# Patient Record
Sex: Female | Born: 1990 | Race: White | Hispanic: No | Marital: Married | Smoking: Never smoker
Health system: Southern US, Community
[De-identification: ages and names within clinical notes are randomized; demographics above are authoritative.]

## PROBLEM LIST (undated history)

## (undated) ENCOUNTER — Inpatient Hospital Stay (HOSPITAL_COMMUNITY): Payer: Self-pay

## (undated) DIAGNOSIS — F419 Anxiety disorder, unspecified: Secondary | ICD-10-CM

## (undated) DIAGNOSIS — N12 Tubulo-interstitial nephritis, not specified as acute or chronic: Secondary | ICD-10-CM

## (undated) DIAGNOSIS — F32A Depression, unspecified: Secondary | ICD-10-CM

## (undated) DIAGNOSIS — S060XAA Concussion with loss of consciousness status unknown, initial encounter: Secondary | ICD-10-CM

## (undated) DIAGNOSIS — O98811 Other maternal infectious and parasitic diseases complicating pregnancy, first trimester: Secondary | ICD-10-CM

## (undated) DIAGNOSIS — F329 Major depressive disorder, single episode, unspecified: Secondary | ICD-10-CM

## (undated) DIAGNOSIS — S060X9A Concussion with loss of consciousness of unspecified duration, initial encounter: Secondary | ICD-10-CM

## (undated) DIAGNOSIS — F99 Mental disorder, not otherwise specified: Secondary | ICD-10-CM

## (undated) DIAGNOSIS — A749 Chlamydial infection, unspecified: Secondary | ICD-10-CM

## (undated) DIAGNOSIS — E039 Hypothyroidism, unspecified: Secondary | ICD-10-CM

## (undated) HISTORY — DX: Concussion with loss of consciousness of unspecified duration, initial encounter: S06.0X9A

## (undated) HISTORY — PX: TONSILLECTOMY: SUR1361

## (undated) HISTORY — DX: Concussion with loss of consciousness status unknown, initial encounter: S06.0XAA

---
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
## 2011-09-10 LAB — HM PAP SMEAR: HM Pap smear: NORMAL

## 2015-02-04 ENCOUNTER — Ambulatory Visit (INDEPENDENT_AMBULATORY_CARE_PROVIDER_SITE_OTHER): Payer: BLUE CROSS/BLUE SHIELD | Admitting: Nurse Practitioner

## 2015-02-04 ENCOUNTER — Encounter: Payer: Self-pay | Admitting: Nurse Practitioner

## 2015-02-04 VITALS — BP 120/78 | HR 73 | Temp 98.3°F | Resp 12 | Ht 64.0 in | Wt 176.8 lb

## 2015-02-04 DIAGNOSIS — Z7689 Persons encountering health services in other specified circumstances: Secondary | ICD-10-CM

## 2015-02-04 DIAGNOSIS — Z7189 Other specified counseling: Secondary | ICD-10-CM | POA: Diagnosis not present

## 2015-02-04 DIAGNOSIS — R197 Diarrhea, unspecified: Secondary | ICD-10-CM

## 2015-02-04 LAB — CBC WITH DIFFERENTIAL/PLATELET
BASOS PCT: 0 % (ref 0–1)
Basophils Absolute: 0 10*3/uL (ref 0.0–0.1)
EOS ABS: 0.1 10*3/uL (ref 0.0–0.7)
EOS PCT: 1 % (ref 0–5)
HCT: 39.1 % (ref 36.0–46.0)
Hemoglobin: 13.5 g/dL (ref 12.0–15.0)
Lymphocytes Relative: 28 % (ref 12–46)
Lymphs Abs: 2 10*3/uL (ref 0.7–4.0)
MCH: 30.2 pg (ref 26.0–34.0)
MCHC: 34.5 g/dL (ref 30.0–36.0)
MCV: 87.5 fL (ref 78.0–100.0)
MONO ABS: 0.6 10*3/uL (ref 0.1–1.0)
MONOS PCT: 9 % (ref 3–12)
MPV: 10 fL (ref 8.6–12.4)
NEUTROS ABS: 4.3 10*3/uL (ref 1.7–7.7)
Neutrophils Relative %: 62 % (ref 43–77)
PLATELETS: 304 10*3/uL (ref 150–400)
RBC: 4.47 MIL/uL (ref 3.87–5.11)
RDW: 12.2 % (ref 11.5–15.5)
WBC: 7 10*3/uL (ref 4.0–10.5)

## 2015-02-04 NOTE — Progress Notes (Signed)
Patient ID: Linda Dominguez, female    DOB: Oct 22, 1990  Age: 24 y.o. MRN: [id number]  CC: Establish Care   HPI Linda Dominguez presents for establishing care and CC of irregular cycles on nexplanon and diarrhea.   1) New pt info:   Immunizations- Will await records    In the [place]   Pap- Not UTD  Eye Exam- Not UTD  Dental Exam- UTD   LMP- 01/29/2015  2) Chronic Problems-  Overweight-    Health diet   Exercise- runs daily   3) Acute Problems-  Nexplanon- 24 years old in May, irregular periods  Diarrhea- since 24 yo, worse in the last 2 years,  Blood in stool, not with wiping, thinks it is dried up blood/dark, mucous every time  4-5 x a day BM- watery/runny  Stopped eating red meat- intensifies symptoms  Happens immediately with cramping  Lots of stressors- school, 2 jobs, and Nature conservation officer duty  Medications- Miralax if not gone for 1 week   Imodium- helpful  No recent abx use  History Linda Dominguez has a past medical history of Concussion.   She has past surgical history that includes Tonsillectomy and adenoidectomy (1995).   Her family history includes Alcohol abuse in her maternal grandmother; Depression in her father; Diabetes in her maternal grandfather and maternal grandmother; Early death in her paternal grandmother; Heart disease in her maternal grandfather and maternal grandmother; Hyperlipidemia in her maternal grandmother; Mental illness in her father; Stroke in her maternal grandfather.She reports that she has never smoked. She has never used smokeless tobacco. She reports that she drinks alcohol. She reports that she does not use illicit drugs.  No outpatient prescriptions prior to visit.   No facility-administered medications prior to visit.   ROS Review of Systems  Constitutional: Negative for fever, chills, diaphoresis and fatigue.  Respiratory: Negative for chest tightness, shortness of breath and wheezing.   Cardiovascular: Negative for chest pain,  palpitations and leg swelling.  Gastrointestinal: Positive for abdominal pain, diarrhea, constipation and blood in stool. Negative for nausea, vomiting, abdominal distention, anal bleeding and rectal pain.  Skin: Negative for rash.  Neurological: Negative for dizziness, weakness, numbness and headaches.  Psychiatric/Behavioral: The patient is nervous/anxious.        Lots of stressors currently   Objective:  BP 120/78 mmHg  Pulse 73  Temp(Src) 98.3 F (36.8 C)  Resp 12  Ht _0  (1.626 m)  Wt 176 lb 12.8 oz (80.196 kg)  BMI 30.33 kg/m2  SpO2 97%  Physical Exam  Constitutional: She is oriented to person, place, and time. She appears well-developed and well-nourished. No distress.  HENT:  Head: Normocephalic and atraumatic.  Right Ear: External ear normal.  Left Ear: External ear normal.  Cardiovascular: Normal rate, regular rhythm and normal heart sounds.   Pulmonary/Chest: Effort normal and breath sounds normal. No respiratory distress. She has no wheezes. She has no rales. She exhibits no tenderness.  Abdominal: Soft. Bowel sounds are normal. She exhibits no distension and no mass. There is tenderness. There is no rebound and no guarding.  Generalized tenderness  Genitourinary:  Deferred  Neurological: She is alert and oriented to person, place, and time. No cranial nerve deficit. She exhibits normal muscle tone. Coordination normal.  Skin: Skin is warm and dry. No rash noted. She is not diaphoretic.  Psychiatric: She has a normal mood and affect. Her behavior is normal. Judgment and thought content normal.   Assessment & Plan:   Lynette was seen  today for establish care.  Diagnoses and all orders for this visit:  Encounter to establish care -     CBC with Differential/Platelet; Future -     Comp Met (CMET) -     CBC with Differential/Platelet  Diarrhea, unspecified type -     Ambulatory referral to Gastroenterology -     Cancel: CBC with Differential/Platelet -      Cancel: Comp Met (CMET) -     CBC with Differential/Platelet; Future -     Comp Met (CMET) -     CBC with Differential/Platelet   I am having Linda Dominguez maintain her Etonogestrel (NEXPLANON [place]).  Meds ordered this encounter  Medications  . Etonogestrel (NEXPLANON )    Sig: Inject into the skin.     Follow-up: Return if symptoms worsen or fail to improve.

## 2015-02-04 NOTE — Patient Instructions (Signed)
Nice to meet you! Follow up after your gastroenterology appointment with me (you can call and make this appointment).   We will call you with the referral to gastroenterology.

## 2015-02-04 NOTE — Assessment & Plan Note (Addendum)
Diarrhea has been going on since she was age 24 and patientreports she has talked to multiple people about this and was diagnosed with IBS diarrhea predominant.she would prefer to go to a gastroenterologist for evaluation and treatment. Ambulatory referral for gastroenterology was placed we'll follow-up after this appointment .  We'll obtain C met CBC with differential

## 2015-02-04 NOTE — Assessment & Plan Note (Signed)
Discussed acute and chronic issues. Reviewed health maintenance measures, PFSHx, and immunizations. Obtain records from previous facility.   

## 2015-02-04 NOTE — Progress Notes (Signed)
Pre visit review using our clinic review tool, if applicable. No additional management support is needed unless otherwise documented below in the visit note. 

## 2015-02-05 LAB — COMPREHENSIVE METABOLIC PANEL
ALK PHOS: 61 U/L (ref 33–115)
ALT: 17 U/L (ref 6–29)
AST: 16 U/L (ref 10–30)
Albumin: 4.4 g/dL (ref 3.6–5.1)
BILIRUBIN TOTAL: 0.5 mg/dL (ref 0.2–1.2)
BUN: 12 mg/dL (ref 7–25)
CO2: 28 mmol/L (ref 20–31)
Calcium: 9.5 mg/dL (ref 8.6–10.2)
Chloride: 100 mmol/L (ref 98–110)
Creat: 0.75 mg/dL (ref 0.50–1.10)
GLUCOSE: 80 mg/dL (ref 65–99)
POTASSIUM: 4.6 mmol/L (ref 3.5–5.3)
Sodium: 135 mmol/L (ref 135–146)
TOTAL PROTEIN: 7.3 g/dL (ref 6.1–8.1)

## 2015-02-23 ENCOUNTER — Other Ambulatory Visit: Payer: Self-pay | Admitting: Nurse Practitioner

## 2015-02-23 DIAGNOSIS — R194 Change in bowel habit: Secondary | ICD-10-CM

## 2015-03-02 ENCOUNTER — Ambulatory Visit: Payer: BLUE CROSS/BLUE SHIELD

## 2015-03-04 ENCOUNTER — Ambulatory Visit: Admission: RE | Admit: 2015-03-04 | Payer: BLUE CROSS/BLUE SHIELD | Source: Ambulatory Visit

## 2015-03-18 ENCOUNTER — Encounter: Payer: BLUE CROSS/BLUE SHIELD | Admitting: Nurse Practitioner

## 2015-05-01 DIAGNOSIS — E039 Hypothyroidism, unspecified: Secondary | ICD-10-CM

## 2015-05-01 HISTORY — DX: Hypothyroidism, unspecified: E03.9

## 2015-05-06 ENCOUNTER — Ambulatory Visit: Payer: BLUE CROSS/BLUE SHIELD | Admitting: Registered Nurse

## 2015-05-06 ENCOUNTER — Ambulatory Visit
Admission: RE | Admit: 2015-05-06 | Discharge: 2015-05-06 | Disposition: A | Discharge status: Home / Self Care | Payer: BLUE CROSS/BLUE SHIELD | Source: Ambulatory Visit | Attending: Gastroenterology | Admitting: Gastroenterology

## 2015-05-06 ENCOUNTER — Encounter
Admission: RE | Disposition: A | Discharge status: Home / Self Care | Payer: Self-pay | Source: Ambulatory Visit | Attending: Gastroenterology

## 2015-05-06 ENCOUNTER — Encounter: Payer: Self-pay | Admitting: *Deleted

## 2015-05-06 DIAGNOSIS — R194 Change in bowel habit: Secondary | ICD-10-CM | POA: Insufficient documentation

## 2015-05-06 HISTORY — PX: COLONOSCOPY WITH PROPOFOL: SHX5780

## 2015-05-06 LAB — POCT PREGNANCY, URINE: Preg Test, Ur: NEGATIVE

## 2015-05-06 SURGERY — COLONOSCOPY WITH PROPOFOL
Anesthesia: General

## 2015-05-06 MED ORDER — FENTANYL CITRATE (PF) 100 MCG/2ML IJ SOLN
INTRAMUSCULAR | Status: DC | PRN
  Administered 2015-05-06: 50 ug via INTRAVENOUS

## 2015-05-06 MED ORDER — SODIUM CHLORIDE 0.9 % IV SOLN: INTRAVENOUS | Status: DC

## 2015-05-06 MED ORDER — PROPOFOL 10 MG/ML IV BOLUS
INTRAVENOUS | Status: DC | PRN
  Administered 2015-05-06: 50 mg via INTRAVENOUS

## 2015-05-06 MED ORDER — PROPOFOL 500 MG/50ML IV EMUL
INTRAVENOUS | Status: DC | PRN
  Administered 2015-05-06: 170 ug/kg/min via INTRAVENOUS

## 2015-05-06 MED ORDER — SODIUM CHLORIDE 0.9 % IV SOLN
INTRAVENOUS | Status: DC
  Administered 2015-05-06: 1000 mL via INTRAVENOUS

## 2015-05-06 MED ORDER — MIDAZOLAM HCL 2 MG/2ML IJ SOLN
INTRAMUSCULAR | Status: DC | PRN
  Administered 2015-05-06: 1 mg via INTRAVENOUS

## 2015-05-06 MED ORDER — LIDOCAINE HCL (CARDIAC) 20 MG/ML IV SOLN
INTRAVENOUS | Status: DC | PRN
  Administered 2015-05-06: 40 mg via INTRAVENOUS

## 2015-05-06 NOTE — Op Note (Signed)
[facility] Gastroenterology Patient Name: Linda Dominguez Procedure Date: 05/06/2015 9:08 AM MRN: [id number] Account #: [id number] Date of Birth: 10/21/1990 Admit Type: Outpatient Age: 25 Room: [facility] ENDO ROOM 4 Gender: Female Note Status: Finalized Procedure:         Colonoscopy Indications:       Change in bowel habits Providers:         Ezzard Standing. Bluford Kaufmann, MD Referring MD:      Naomie Dean (Referring MD) Medicines:         Monitored Anesthesia Care Complications:     No immediate complications. Procedure:         Pre-Anesthesia Assessment:                    - Prior to the procedure, a History and Physical was                     performed, and patient medications, allergies and                     sensitivities were reviewed. The patient's tolerance of                     previous anesthesia was reviewed.                    - The risks and benefits of the procedure and the sedation                     options and risks were discussed with the patient. All                     questions were answered and informed consent was obtained.                    - After reviewing the risks and benefits, the patient was                     deemed in satisfactory condition to undergo the procedure.                    After obtaining informed consent, the colonoscope was                     passed under direct vision. Throughout the procedure, the                     patient's blood pressure, pulse, and oxygen saturations                     were monitored continuously. The Colonoscope was                     introduced through the anus and advanced to the the                     terminal ileum, with identification of the appendiceal                     orifice and IC valve. The colonoscopy was performed                     without difficulty. The patient tolerated the procedure  well. The quality of the bowel preparation was good. Findings:      The colon (entire  examined portion) appeared normal. Biopsies for       histology were taken with a cold forceps from the entire colon for       evaluation of microscopic colitis.      The terminal ileum appeared normal. Biopsies were taken with a cold       forceps for histology. Impression:        - The entire examined colon is normal. Biopsied.                    - The examined portion of the ileum was normal. Biopsied. Recommendation:    - Discharge patient to home.                    - Await pathology results.                    - The findings and recommendations were discussed with the                     patient. Procedure Code(s): --- Professional ---                    [id number], Colonoscopy, flexible; with biopsy, single or                     multiple Diagnosis Code(s): --- Professional ---                    R19.4, Change in bowel habit CPT copyright 2014 American Medical Association. All rights reserved. The codes documented in this report are preliminary and upon coder review may  be revised to meet current compliance requirements. Wallace CullensPaul Y Yarelly Kuba, MD 05/06/2015 9:28:09 AM This report has been signed electronically. Number of Addenda: 0 Note Initiated On: 05/06/2015 9:08 AM Scope Withdrawal Time: 0 hours 6 minutes 1 second  Total Procedure Duration: 0 hours 12 minutes 0 seconds       [facility]

## 2015-05-06 NOTE — H&P (Signed)
    Primary Care Physician:  Carollee Leitzoss, Carrie M, NP Primary Gastroenterologist:  Dr. Bluford Kaufmannh  Pre-Procedure History & Physical: HPI:  Linda Dominguez is a 25 y.o. female is here for an colonoscopy.  Past Medical History  Diagnosis Date  . Concussion     She's had 3, last one was when she was 20  . Medical history non-contributory     Past Surgical History  Procedure Laterality Date  . Tonsillectomy and adenoidectomy  1995  . Tonsillectomy      Prior to Admission medications   Medication Sig Start Date End Date Taking? Authorizing Provider  dicyclomine (BENTYL) 10 MG capsule Take 10 mg by mouth 4 (four) times daily -  before meals and at bedtime.   Yes Historical Provider, MD  Etonogestrel (NEXPLANON [place]) Inject into the skin.    Historical Provider, MD    Allergies as of 02/23/2015  . (No Known Allergies)    Family History  Problem Relation Age of Onset  . Depression Father   . Mental illness Father     Attempted Suicide  . Alcohol abuse Maternal Grandmother   . Hyperlipidemia Maternal Grandmother   . Heart disease Maternal Grandmother   . Diabetes Maternal Grandmother   . Heart disease Maternal Grandfather   . Stroke Maternal Grandfather   . Diabetes Maternal Grandfather   . Early death Paternal Grandmother     Social History   Social History  . Marital Status: Single    Spouse Name: N/A  . Number of Children: N/A  . Years of Education: N/A   Occupational History  . Not on file.   Social History Main Topics  . Smoking status: Never Smoker   . Smokeless tobacco: Never Used  . Alcohol Use: 0.0 oz/week    0 Standard drinks or equivalent per week     Comment: Socially   . Drug Use: No  . Sexual Activity:    Partners: Male    Birth Control/ Protection: Implant   Other Topics Concern  . 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 Neck:  Supple; no masses or thyromegaly. Lungs:  Clear throughout to auscultation.    Heart:  Regular rate and rhythm. Abdomen:  Soft, nontender and nondistended. Normal bowel sounds, without guarding, and without rebound.   Neurologic:  Alert and  oriented x4;  grossly normal neurologically.  Impression/Plan: Linda Dominguez is here for an colonoscopy to be performed for changes in bowel habits  Risks, benefits, limitations, and alternatives regarding  colonsocopy have been reviewed with the patient.  Questions have been answered.  All parties agreeable.   Lilian Fuhs, Ezzard StandingPAUL Y, MD  05/06/2015, 9:10 AM

## 2015-05-06 NOTE — Anesthesia Procedure Notes (Signed)
Date/Time: 05/06/2015 9:08 AM Performed by: Stormy FabianURTIS, Audrick Lamoureaux Pre-anesthesia Checklist: Patient identified, Emergency Drugs available, Suction available and Patient being monitored Patient Re-evaluated:Patient Re-evaluated prior to inductionOxygen Delivery Method: Nasal cannula Intubation Type: IV induction Dental Injury: Teeth and Oropharynx as per pre-operative assessment  Comments: Nasal cannula with etCO2 monitoring

## 2015-05-06 NOTE — Transfer of Care (Signed)
Immediate Anesthesia Transfer of Care Note  Patient: Linda Dominguez  Procedure(s) Performed: Procedure(s): COLONOSCOPY WITH PROPOFOL (N/A)  Patient Location: PACU and Endoscopy Unit  Anesthesia Type:General  Level of Consciousness: sedated  Airway & Oxygen Therapy: Patient Spontanous Breathing and Patient connected to nasal cannula oxygen  Post-op Assessment: Report given to RN and Post -op Vital signs reviewed and stable  Post vital signs: Reviewed and stable  Last Vitals:  Filed Vitals:   05/06/15 0844 05/06/15 0930  BP: 121/76 99/64  Pulse: 64 78  Temp: 35.4 C 36.5 C  Resp: 16 18    Complications: No apparent anesthesia complications

## 2015-05-06 NOTE — Anesthesia Preprocedure Evaluation (Signed)
Anesthesia Evaluation  Patient identified by MRN, date of birth, ID band Patient awake    History of Anesthesia Complications Negative for: history of anesthetic complications  Airway Mallampati: II       Dental  (+) Teeth Intact   Pulmonary neg pulmonary ROS,    Pulmonary exam normal        Cardiovascular negative cardio ROS   Rhythm:Regular Rate:Normal     Neuro/Psych    GI/Hepatic negative GI ROS, Neg liver ROS,   Endo/Other  negative endocrine ROS  Renal/GU negative Renal ROS     Musculoskeletal negative musculoskeletal ROS (+)   Abdominal Normal abdominal exam  (+)   Peds negative pediatric ROS (+)  Hematology negative hematology ROS (+)   Anesthesia Other Findings   Reproductive/Obstetrics                             Anesthesia Physical Anesthesia Plan  ASA: II  Anesthesia Plan: General   Post-op Pain Management:    Induction: Intravenous  Airway Management Planned: Nasal Cannula  Additional Equipment:   Intra-op Plan:   Post-operative Plan: Extubation in OR  Informed Consent: I have reviewed the patients History and Physical, chart, labs and discussed the procedure including the risks, benefits and alternatives for the proposed anesthesia with the patient or authorized representative who has indicated his/her understanding and acceptance.     Plan Discussed with: CRNA  Anesthesia Plan Comments:         Anesthesia Quick Evaluation

## 2015-05-06 NOTE — Anesthesia Postprocedure Evaluation (Signed)
Anesthesia Post Note  Patient: Linda Dominguez  Procedure(s) Performed: Procedure(s) (LRB): COLONOSCOPY WITH PROPOFOL (N/A)  Patient location during evaluation: PACU Anesthesia Type: General Level of consciousness: awake Pain management: satisfactory to patient Vital Signs Assessment: post-procedure vital signs reviewed and stable Respiratory status: spontaneous breathing Cardiovascular status: stable Anesthetic complications: no    Last Vitals:  Filed Vitals:   05/06/15 0925 05/06/15 0930  BP:  99/64  Pulse:  78  Temp: 36.5 C 36.5 C  Resp:  18    Last Pain:  Filed Vitals:   05/06/15 0931  PainSc: 0-No pain                 VAN STAVEREN,Keante Urizar

## 2015-05-09 LAB — SURGICAL PATHOLOGY

## 2015-05-12 ENCOUNTER — Encounter: Payer: Self-pay | Admitting: Gastroenterology

## 2015-06-27 ENCOUNTER — Ambulatory Visit (INDEPENDENT_AMBULATORY_CARE_PROVIDER_SITE_OTHER): Payer: BLUE CROSS/BLUE SHIELD | Admitting: Nurse Practitioner

## 2015-06-27 ENCOUNTER — Encounter: Payer: Self-pay | Admitting: Nurse Practitioner

## 2015-06-27 ENCOUNTER — Other Ambulatory Visit (HOSPITAL_COMMUNITY)
Admission: RE | Admit: 2015-06-27 | Discharge: 2015-06-27 | Disposition: A | Discharge status: Home / Self Care | Payer: BLUE CROSS/BLUE SHIELD | Source: Ambulatory Visit | Attending: Nurse Practitioner | Admitting: Nurse Practitioner

## 2015-06-27 VITALS — BP 108/62 | HR 74 | Temp 98.3°F | Resp 14 | Ht 65.0 in | Wt 186.6 lb

## 2015-06-27 DIAGNOSIS — Z0000 Encounter for general adult medical examination without abnormal findings: Secondary | ICD-10-CM | POA: Diagnosis not present

## 2015-06-27 DIAGNOSIS — Z1151 Encounter for screening for human papillomavirus (HPV): Secondary | ICD-10-CM | POA: Insufficient documentation

## 2015-06-27 DIAGNOSIS — Z01419 Encounter for gynecological examination (general) (routine) without abnormal findings: Secondary | ICD-10-CM | POA: Diagnosis not present

## 2015-06-27 LAB — COMPREHENSIVE METABOLIC PANEL WITH GFR
ALT: 23 U/L (ref 0–35)
AST: 19 U/L (ref 0–37)
Albumin: 4.6 g/dL (ref 3.5–5.2)
Alkaline Phosphatase: 55 U/L (ref 39–117)
BUN: 12 mg/dL (ref 6–23)
CO2: 25 meq/L (ref 19–32)
Calcium: 9.4 mg/dL (ref 8.4–10.5)
Chloride: 103 meq/L (ref 96–112)
Creatinine, Ser: 0.65 mg/dL (ref 0.40–1.20)
GFR: 119.66 mL/min
Glucose, Bld: 84 mg/dL (ref 70–99)
Potassium: 4.1 meq/L (ref 3.5–5.1)
Sodium: 136 meq/L (ref 135–145)
Total Bilirubin: 0.5 mg/dL (ref 0.2–1.2)
Total Protein: 8 g/dL (ref 6.0–8.3)

## 2015-06-27 LAB — CBC WITH DIFFERENTIAL/PLATELET
BASOS ABS: 0 10*3/uL (ref 0.0–0.1)
Basophils Relative: 0.6 % (ref 0.0–3.0)
EOS ABS: 0.1 10*3/uL (ref 0.0–0.7)
Eosinophils Relative: 1.5 % (ref 0.0–5.0)
HCT: 39.6 % (ref 36.0–46.0)
Hemoglobin: 13.4 g/dL (ref 12.0–15.0)
LYMPHS ABS: 1.9 10*3/uL (ref 0.7–4.0)
Lymphocytes Relative: 38.8 % (ref 12.0–46.0)
MCHC: 33.8 g/dL (ref 30.0–36.0)
MCV: 88.3 fl (ref 78.0–100.0)
MONOS PCT: 8.1 % (ref 3.0–12.0)
Monocytes Absolute: 0.4 10*3/uL (ref 0.1–1.0)
NEUTROS ABS: 2.5 10*3/uL (ref 1.4–7.7)
NEUTROS PCT: 51 % (ref 43.0–77.0)
PLATELETS: 319 10*3/uL (ref 150.0–400.0)
RBC: 4.48 Mil/uL (ref 3.87–5.11)
RDW: 13.4 % (ref 11.5–15.5)
WBC: 5 10*3/uL (ref 4.0–10.5)

## 2015-06-27 LAB — LIPID PANEL
Cholesterol: 163 mg/dL (ref 0–200)
HDL: 45.8 mg/dL
LDL Cholesterol: 109 mg/dL — ABNORMAL HIGH (ref 0–99)
NonHDL: 117.05
Total CHOL/HDL Ratio: 4
Triglycerides: 41 mg/dL (ref 0.0–149.0)
VLDL: 8.2 mg/dL (ref 0.0–40.0)

## 2015-06-27 LAB — TSH: TSH: 3.67 u[IU]/mL (ref 0.35–4.50)

## 2015-06-27 NOTE — Patient Instructions (Signed)
Health Maintenance, Female Adopting a healthy lifestyle and getting preventive care can go a long way to promote health and wellness. Talk with your health care provider about what schedule of regular examinations is right for you. This is a good chance for you to check in with your provider about disease prevention and staying healthy. In between checkups, there are plenty of things you can do on your own. Experts have done a lot of research about which lifestyle changes and preventive measures are most likely to keep you healthy. Ask your health care provider for more information. WEIGHT AND DIET  Eat a healthy diet  Be sure to include plenty of vegetables, fruits, low-fat dairy products, and lean protein.  Do not eat a lot of foods high in solid fats, added sugars, or salt.  Get regular exercise. This is one of the most important things you can do for your health.  Most adults should exercise for at least 150 minutes each week. The exercise should increase your heart rate and make you sweat (moderate-intensity exercise).  Most adults should also do strengthening exercises at least twice a week. This is in addition to the moderate-intensity exercise.  Maintain a healthy weight  Body mass index (BMI) is a measurement that can be used to identify possible weight problems. It estimates body fat based on height and weight. Your health care provider can help determine your BMI and help you achieve or maintain a healthy weight.  For females 20 years of age and older:   A BMI below 18.5 is considered underweight.  A BMI of 18.5 to 24.9 is normal.  A BMI of 25 to 29.9 is considered overweight.  A BMI of 30 and above is considered obese.  Watch levels of cholesterol and blood lipids  You should start having your blood tested for lipids and cholesterol at 24 years of age, then have this test every 5 years.  You may need to have your cholesterol levels checked more often if:  Your lipid  or cholesterol levels are high.  You are older than 24 years of age.  You are at high risk for heart disease.  CANCER SCREENING   Lung Cancer  Lung cancer screening is recommended for adults 55-80 years old who are at high risk for lung cancer because of a history of smoking.  A yearly low-dose CT scan of the lungs is recommended for people who:  Currently smoke.  Have quit within the past 15 years.  Have at least a 30-pack-year history of smoking. A pack year is smoking an average of one pack of cigarettes a day for 1 year.  Yearly screening should continue until it has been 15 years since you quit.  Yearly screening should stop if you develop a health problem that would prevent you from having lung cancer treatment.  Breast Cancer  Practice breast self-awareness. This means understanding how your breasts normally appear and feel.  It also means doing regular breast self-exams. Let your health care provider know about any changes, no matter how small.  If you are in your 20s or 30s, you should have a clinical breast exam (CBE) by a health care provider every 1-3 years as part of a regular health exam.  If you are 40 or older, have a CBE every year. Also consider having a breast X-ray (mammogram) every year.  If you have a family history of breast cancer, talk to your health care provider about genetic screening.  If you   are at high risk for breast cancer, talk to your health care provider about having an MRI and a mammogram every year.  Breast cancer gene (BRCA) assessment is recommended for women who have family members with BRCA-related cancers. BRCA-related cancers include:  Breast.  Ovarian.  Tubal.  Peritoneal cancers.  Results of the assessment will determine the need for genetic counseling and BRCA1 and BRCA2 testing. Cervical Cancer Your health care provider may recommend that you be screened regularly for cancer of the pelvic organs (ovaries, uterus, and  vagina). This screening involves a pelvic examination, including checking for microscopic changes to the surface of your cervix (Pap test). You may be encouraged to have this screening done every 3 years, beginning at age 21.  For women ages 30-65, health care providers may recommend pelvic exams and Pap testing every 3 years, or they may recommend the Pap and pelvic exam, combined with testing for human papilloma virus (HPV), every 5 years. Some types of HPV increase your risk of cervical cancer. Testing for HPV may also be done on women of any age with unclear Pap test results.  Other health care providers may not recommend any screening for nonpregnant women who are considered low risk for pelvic cancer and who do not have symptoms. Ask your health care provider if a screening pelvic exam is right for you.  If you have had past treatment for cervical cancer or a condition that could lead to cancer, you need Pap tests and screening for cancer for at least 20 years after your treatment. If Pap tests have been discontinued, your risk factors (such as having a new sexual partner) need to be reassessed to determine if screening should resume. Some women have medical problems that increase the chance of getting cervical cancer. In these cases, your health care provider may recommend more frequent screening and Pap tests. Colorectal Cancer  This type of cancer can be detected and often prevented.  Routine colorectal cancer screening usually begins at 24 years of age and continues through 24 years of age.  Your health care provider may recommend screening at an earlier age if you have risk factors for colon cancer.  Your health care provider may also recommend using home test kits to check for hidden blood in the stool.  A small camera at the end of a tube can be used to examine your colon directly (sigmoidoscopy or colonoscopy). This is done to check for the earliest forms of colorectal  cancer.  Routine screening usually begins at age 50.  Direct examination of the colon should be repeated every 5-10 years through 24 years of age. However, you may need to be screened more often if early forms of precancerous polyps or small growths are found. Skin Cancer  Check your skin from head to toe regularly.  Tell your health care provider about any new moles or changes in moles, especially if there is a change in a mole's shape or color.  Also tell your health care provider if you have a mole that is larger than the size of a pencil eraser.  Always use sunscreen. Apply sunscreen liberally and repeatedly throughout the day.  Protect yourself by wearing long sleeves, pants, a wide-brimmed hat, and sunglasses whenever you are outside. HEART DISEASE, DIABETES, AND HIGH BLOOD PRESSURE   High blood pressure causes heart disease and increases the risk of stroke. High blood pressure is more likely to develop in:  People who have blood pressure in the high end   of the normal range (130-139/85-89 mm Hg).  People who are overweight or obese.  People who are African American.  If you are 38-23 years of age, have your blood pressure checked every 3-5 years. If you are 61 years of age or older, have your blood pressure checked every year. You should have your blood pressure measured twice--once when you are at a hospital or clinic, and once when you are not at a hospital or clinic. Record the average of the two measurements. To check your blood pressure when you are not at a hospital or clinic, you can use:  An automated blood pressure machine at a pharmacy.  A home blood pressure monitor.  If you are between 45 years and 39 years old, ask your health care provider if you should take aspirin to prevent strokes.  Have regular diabetes screenings. This involves taking a blood sample to check your fasting blood sugar level.  If you are at a normal weight and have a low risk for diabetes,  have this test once every three years after 24 years of age.  If you are overweight and have a high risk for diabetes, consider being tested at a younger age or more often. PREVENTING INFECTION  Hepatitis B  If you have a higher risk for hepatitis B, you should be screened for this virus. You are considered at high risk for hepatitis B if:  You were born in a country where hepatitis B is common. Ask your health care provider which countries are considered high risk.  Your parents were born in a high-risk country, and you have not been immunized against hepatitis B (hepatitis B vaccine).  You have HIV or AIDS.  You use needles to inject street drugs.  You live with someone who has hepatitis B.  You have had sex with someone who has hepatitis B.  You get hemodialysis treatment.  You take certain medicines for conditions, including cancer, organ transplantation, and autoimmune conditions. Hepatitis C  Blood testing is recommended for:  Everyone born from [birth year] through 1964.  Anyone with known risk factors for hepatitis C. Sexually transmitted infections (STIs)  You should be screened for sexually transmitted infections (STIs) including gonorrhea and chlamydia if:  You are sexually active and are younger than 24 years of age.  You are older than 24 years of age and your health care provider tells you that you are at risk for this type of infection.  Your sexual activity has changed since you were last screened and you are at an increased risk for chlamydia or gonorrhea. Ask your health care provider if you are at risk.  If you do not have HIV, but are at risk, it may be recommended that you take a prescription medicine daily to prevent HIV infection. This is called pre-exposure prophylaxis (PrEP). You are considered at risk if:  You are sexually active and do not regularly use condoms or know the HIV status of your partner(s).  You take drugs by injection.  You are sexually  active with a partner who has HIV. Talk with your health care provider about whether you are at high risk of being infected with HIV. If you choose to begin PrEP, you should first be tested for HIV. You should then be tested every 3 months for as long as you are taking PrEP.  PREGNANCY   If you are premenopausal and you may become pregnant, ask your health care provider about preconception counseling.  If you may  become pregnant, take 400 to 800 micrograms (mcg) of folic acid every day.  If you want to prevent pregnancy, talk to your health care provider about birth control (contraception). OSTEOPOROSIS AND MENOPAUSE   Osteoporosis is a disease in which the bones lose minerals and strength with aging. This can result in serious bone fractures. Your risk for osteoporosis can be identified using a bone density scan.  If you are 61 years of age or older, or if you are at risk for osteoporosis and fractures, ask your health care provider if you should be screened.  Ask your health care provider whether you should take a calcium or vitamin D supplement to lower your risk for osteoporosis.  Menopause may have certain physical symptoms and risks.  Hormone replacement therapy may reduce some of these symptoms and risks. Talk to your health care provider about whether hormone replacement therapy is right for you.  HOME CARE INSTRUCTIONS   Schedule regular health, dental, and eye exams.  Stay current with your immunizations.   Do not use any tobacco products including cigarettes, chewing tobacco, or electronic cigarettes.  If you are pregnant, do not drink alcohol.  If you are breastfeeding, limit how much and how often you drink alcohol.  Limit alcohol intake to no more than 1 drink per day for nonpregnant women. One drink equals 12 ounces of beer, 5 ounces of wine, or 1 ounces of hard liquor.  Do not use street drugs.  Do not share needles.  Ask your health care provider for help if  you need support or information about quitting drugs.  Tell your health care provider if you often feel depressed.  Tell your health care provider if you have ever been abused or do not feel safe at home.   This information is not intended to replace advice given to you by your health care provider. Make sure you discuss any questions you have with your health care provider.   Document Released: 10/30/2010 Document Revised: 05/07/2014 Document Reviewed: 03/18/2013 Elsevier Interactive Patient Education [employer].

## 2015-06-27 NOTE — Assessment & Plan Note (Addendum)
Discussed acute and chronic issues. Reviewed health maintenance measures, PFSHx, and immunizations. Obtain routine labs TSH, Lipid panel, CBC w/ diff, and CMET.

## 2015-06-27 NOTE — Progress Notes (Signed)
Patient ID: Linda Dominguez, female    DOB: 1990/09/19  Age: 25 y.o. MRN: [id number]  CC: Annual Exam   HPI Linda Dominguez presents for annual exam w/ PAP and breast exam.  1) Annual Physical   Diet- Eating a healthy diet, red meat- helped with diarrhea   Exercise- 3-4 x week, walking for 20 min.   Immunizations-    Flu- UTD   Tdap- UTD   Eye Exam- Not UTD  Dental Exam- UTD  LMP- 06/02/15   Labs- Fasting   Depression- Neg.  Refills: Denies needing  History Linda Dominguez has a past medical history of Concussion and Medical history non-contributory.   Linda Dominguez has past surgical history that includes Tonsillectomy and adenoidectomy (1995); Tonsillectomy; and Colonoscopy with propofol (N/A, 05/06/2015).   Linda Dominguez family history includes Alcohol abuse in Linda Dominguez maternal grandmother; Depression in Linda Dominguez father; Diabetes in Linda Dominguez maternal grandfather and maternal grandmother; Early death in Linda Dominguez paternal grandmother; Heart disease in Linda Dominguez maternal grandfather and maternal grandmother; Hyperlipidemia in Linda Dominguez maternal grandmother; Mental illness in Linda Dominguez father; Stroke in Linda Dominguez maternal grandfather.Linda Dominguez reports that Linda Dominguez has never smoked. Linda Dominguez has never used smokeless tobacco. Linda Dominguez reports that Linda Dominguez drinks alcohol. Linda Dominguez reports that Linda Dominguez does not use illicit drugs.  Outpatient Prescriptions Prior to Visit  Medication Sig Dispense Refill  . dicyclomine (BENTYL) 10 MG capsule Take 10 mg by mouth 4 (four) times daily -  before meals and at bedtime.    . Etonogestrel (NEXPLANON [place]) Inject into the skin.     No facility-administered medications prior to visit.    ROS Review of Systems  Constitutional: Negative for fever, chills, diaphoresis, fatigue and unexpected weight change.  HENT: Negative for tinnitus and trouble swallowing.   Eyes: Negative for visual disturbance.  Respiratory: Negative for chest tightness, shortness of breath and wheezing.   Cardiovascular: Negative for chest pain, palpitations and leg swelling.  Gastrointestinal:  Negative for nausea, vomiting, abdominal pain, diarrhea, constipation and blood in stool.  Endocrine: Negative for polydipsia, polyphagia and polyuria.  Genitourinary: Negative for dysuria, hematuria, vaginal discharge and vaginal pain.  Musculoskeletal: Negative for myalgias, back pain, arthralgias and gait problem.  Skin: Negative for color change and rash.  Neurological: Negative for dizziness, weakness, numbness and headaches.  Hematological: Does not bruise/bleed easily.  Psychiatric/Behavioral: Negative for suicidal ideas and sleep disturbance. The patient is not nervous/anxious.     Objective:  BP 108/62 mmHg  Pulse 74  Temp(Src) 98.3 F (36.8 C) (Oral)  Resp 14  Ht  (1.651 m)  Wt 186 lb 9.6 oz (84.641 kg)  BMI 31.05 kg/m2  SpO2 97%  LMP 06/02/2015  Physical Exam  Constitutional: Linda Dominguez is oriented to person, place, and time. Linda Dominguez appears well-developed and well-nourished. No distress.  HENT:  Head: Normocephalic and atraumatic.  Right Ear: External ear normal.  Left Ear: External ear normal.  Nose: Nose normal.  Mouth/Throat: Oropharynx is clear and moist. No oropharyngeal exudate.  TMs and canals clear bilaterally  Eyes: Conjunctivae and EOM are normal. Pupils are equal, round, and reactive to light. Right eye exhibits no discharge. Left eye exhibits no discharge. No scleral icterus.  Neck: Normal range of motion. Neck supple. No thyromegaly present.  Cardiovascular: Normal rate, regular rhythm, normal heart sounds and intact distal pulses.  Exam reveals no gallop and no friction rub.   No murmur heard. Pulmonary/Chest: Effort normal and breath sounds normal. No respiratory distress. Linda Dominguez has no wheezes. Linda Dominguez has no rales. Linda Dominguez exhibits no tenderness.  Breast  exam performed w/ out significant findings  Abdominal: Soft. Bowel sounds are normal. Linda Dominguez exhibits no distension and no mass. There is no tenderness. There is no rebound and no guarding.  Genitourinary: Vagina  normal. No vaginal discharge found.  Musculoskeletal: Normal range of motion. Linda Dominguez exhibits no edema or tenderness.  Lymphadenopathy:    Linda Dominguez has no cervical adenopathy.  Neurological: Linda Dominguez is alert and oriented to person, place, and time. Linda Dominguez has normal reflexes. No cranial nerve deficit. Linda Dominguez exhibits normal muscle tone. Coordination normal.  Skin: Skin is warm and dry. No rash noted. Linda Dominguez is not diaphoretic. No erythema. No pallor.  Psychiatric: Linda Dominguez has a normal mood and affect. Linda Dominguez behavior is normal. Judgment and thought content normal.   Assessment & Plan:   Linda Dominguez was seen today for annual exam.  Diagnoses and all orders for this visit:  Routine general medical examination at a health care facility -     Cytology - PAP -     CBC with Differential/Platelet -     Comprehensive metabolic panel -     Lipid panel -     TSH  Encounter for routine gynecological examination -     Cytology - PAP   I am having Linda Dominguez maintain Linda Dominguez Etonogestrel (NEXPLANON [place]), dicyclomine, and polyethylene glycol-electrolytes.  Meds ordered this encounter  Medications  . polyethylene glycol-electrolytes (NULYTELY/GOLYTELY) 420 g solution    Sig:      Follow-up: Return in about 1 year (around 06/26/2016) for CPE .

## 2015-06-29 LAB — CYTOLOGY - PAP

## 2015-07-03 NOTE — Assessment & Plan Note (Signed)
Breast exam and PAP performed today.  No significant findings on either

## 2015-10-25 ENCOUNTER — Ambulatory Visit: Payer: BLUE CROSS/BLUE SHIELD | Admitting: Family Medicine

## 2015-10-25 ENCOUNTER — Telehealth: Payer: Self-pay | Admitting: Nurse Practitioner

## 2015-10-25 NOTE — Telephone Encounter (Signed)
FYI, Pt called not able to make appt today due to her grandmother being sick she has to go out of town. Appt is still on the sch. Pt did not resch appt. Thank you!

## 2015-10-25 NOTE — Telephone Encounter (Signed)
Please advise, this is your 130pm patient. thanks

## 2015-10-25 NOTE — Telephone Encounter (Signed)
Provider aware and gave verbal okay to remove from schedule.

## 2015-10-26 NOTE — Telephone Encounter (Signed)
Ok. Pt already resch. :)

## 2015-10-26 NOTE — Telephone Encounter (Signed)
See below

## 2016-06-27 ENCOUNTER — Encounter: Payer: BLUE CROSS/BLUE SHIELD | Admitting: Family Medicine

## 2016-06-27 ENCOUNTER — Encounter: Payer: BLUE CROSS/BLUE SHIELD | Admitting: Nurse Practitioner

## 2016-06-28 ENCOUNTER — Ambulatory Visit (INDEPENDENT_AMBULATORY_CARE_PROVIDER_SITE_OTHER): Payer: 59

## 2016-06-28 ENCOUNTER — Ambulatory Visit (INDEPENDENT_AMBULATORY_CARE_PROVIDER_SITE_OTHER): Payer: 59 | Admitting: Family Medicine

## 2016-06-28 ENCOUNTER — Encounter: Payer: Self-pay | Admitting: Family Medicine

## 2016-06-28 VITALS — BP 101/71 | HR 66 | Temp 97.9°F | Wt 190.2 lb

## 2016-06-28 DIAGNOSIS — Z309 Encounter for contraceptive management, unspecified: Secondary | ICD-10-CM | POA: Diagnosis not present

## 2016-06-28 DIAGNOSIS — Z0000 Encounter for general adult medical examination without abnormal findings: Secondary | ICD-10-CM | POA: Insufficient documentation

## 2016-06-28 DIAGNOSIS — M542 Cervicalgia: Secondary | ICD-10-CM

## 2016-06-28 NOTE — Assessment & Plan Note (Addendum)
New problem. Patient reports chronic pain which is quite superficial. Unclear etiology and prognosis at this time. X-ray of the cervical spine obtained and was negative. Obtained due to possible radicular symptoms. Patient requesting referral to pain management. Will arrange.

## 2016-06-28 NOTE — Progress Notes (Addendum)
Subjective:  Patient ID: Linda Dominguez, female    DOB: 1991/02/04  Age: 26 y.o. MRN: [id number]  CC: "Shoulder pain", Nexplanon removal/birth control  HPI:  26 year old female presents with the above complaints.  Patient states she's had long-standing pain. She states that his shoulder pain. The pain is located in the lower cervical spine and extends into the trapezius region. She states that her skin is very sensitive to touch and it is exquisitely painful. She has normal range of motion of her neck and shoulder. No true shoulder pain. No reports of fall, trauma, injury. She states that this is been going on since she was a child. She states that she seen several specialists in the past and has been to physical therapy. She's received injections. She is also had massage. She states that she is currently taking tramadol for this (these records are unavailable to me). She has never tried agents such as Lyrica or gabapentin. She states that she has had numbness and tingling in the arm previously but not currently. She states that she describes the pain as a stabbing sensation.  Additionally, patient states that her nexplanon was placed 3 years ago. She is in need of removal. She would like to consider OCPs following removal.  Social Hx   Social History   Social History  . Marital status: Single    Spouse name: N/A  . Number of children: N/A  . Years of education: N/A   Social History Main Topics  . Smoking status: Never Smoker  . Smokeless tobacco: Never Used  . Alcohol use 0.0 oz/week     Comment: Socially   . Drug use: No  . Sexual activity: Not Currently    Partners: Male    Birth control/ protection: Implant   Other Topics Concern  . 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   Musculoskeletal:       Pain, skin (neck/trap).    Objective:  BP 101/71   Pulse 66   Temp 97.9 F (36.6 C) (Oral)   Wt 190 lb 3.2 oz (86.3 kg)   SpO2 99%   BMI 31.65 kg/m   BP/Weight 06/28/2016 06/27/2015 05/06/2015  Systolic BP 101 108 99  Diastolic BP 71 62 64  Wt. (Lbs) 190.2 186.6 180  BMI 31.65 31.05 29.95   Physical Exam  Constitutional: She is oriented to person, place, and time. She appears well-developed. No distress.  HENT:  Head: Normocephalic and atraumatic.  Mouth/Throat: Oropharynx is clear and moist.  Eyes: Conjunctivae are normal.  Neck: Neck supple.  Cardiovascular: Normal rate and regular rhythm.   Pulmonary/Chest: Effort normal and breath sounds normal.  Abdominal: Soft. She exhibits no distension. There is no tenderness. There is no rebound and no guarding.  Musculoskeletal:  Normal exam of the shoulder. Patient does have significant tenderness to palpation of the lower cervical spine and trapezius region on the right. Normal muscle strength of upper extremity.  Neurological: She is alert and oriented to person, place, and time.  Psychiatric: She has a normal mood and affect.  Vitals reviewed.  Lab Results  Component Value Date   WBC 5.0 06/27/2015   HGB 13.4 06/27/2015   HCT 39.6 06/27/2015   PLT 319.0 06/27/2015   GLUCOSE 84 06/27/2015   CHOL 163 06/27/2015  TRIG 41.0 06/27/2015   HDL 45.80 06/27/2015   LDLCALC 109 (H) 06/27/2015   ALT 23 06/27/2015   AST 19 06/27/2015   NA 136 06/27/2015   K 4.1 06/27/2015   CL 103 06/27/2015   CREATININE 0.65 06/27/2015   BUN 12 06/27/2015   CO2 25 06/27/2015   TSH 3.67 06/27/2015    Assessment & Plan:   Problem List Items Addressed This Visit    Neck pain - Primary    New problem. Patient reports chronic pain which is quite superficial. Unclear etiology and prognosis at this time. X-ray of the cervical spine obtained and was negative. Obtained due to possible radicular symptoms. Patient requesting referral to pain management. Will arrange.       Relevant Orders   DG Cervical Spine Complete (Completed)   Ambulatory referral to Pain Clinic   Contraception management    Patient desires removal of nexplanon. We'll schedule patient for procedural appointment for removal. Discussed birth control options today and patient wants to consider OCPs versus condoms/no birth control (in attempts to conceive).        Follow-up: PRN  Everlene Other DO [facility]

## 2016-06-28 NOTE — Progress Notes (Signed)
Pre visit review using our clinic review tool, if applicable. No additional management support is needed unless otherwise documented below in the visit note. 

## 2016-06-28 NOTE — Assessment & Plan Note (Signed)
Patient desires removal of nexplanon. We'll schedule patient for procedural appointment for removal. Discussed birth control options today and patient wants to consider OCPs versus condoms/no birth control (in attempts to conceive).

## 2016-06-28 NOTE — Patient Instructions (Signed)
We will arrange the referral and set up for you to have your nexplanon removed.  Take care  Dr. Adriana Simasook

## 2016-07-09 ENCOUNTER — Ambulatory Visit: Payer: 59 | Admitting: Family Medicine

## 2016-07-19 ENCOUNTER — Encounter: Payer: Self-pay | Admitting: Family Medicine

## 2016-07-19 ENCOUNTER — Ambulatory Visit (INDEPENDENT_AMBULATORY_CARE_PROVIDER_SITE_OTHER): Payer: 59 | Admitting: Family Medicine

## 2016-07-19 VITALS — BP 116/80 | HR 76 | Temp 98.7°F | Wt 196.0 lb

## 2016-07-19 DIAGNOSIS — Z3046 Encounter for surveillance of implantable subdermal contraceptive: Secondary | ICD-10-CM | POA: Diagnosis not present

## 2016-07-19 NOTE — Progress Notes (Signed)
Pre visit review using our clinic review tool, if applicable. No additional management support is needed unless otherwise documented below in the visit note. 

## 2016-07-19 NOTE — Progress Notes (Signed)
    CC: Nexplanon removal.  HPI:   26 year old female presents today for removal of her nexplanon.  She has no complaints or concerns at this time.  Procedure:   Nexplanon Removal Patient was given informed consent for removal of her Nexplanon.  Nexplanon palpated and appear to be deeper in the subcutaneous tissue.  Area prepped in usual sterile fashon. 2 ml of 1% lidocaine w/ epi was used to anesthetize the area at the distal end of the implant. A small incision was made right beside the implant on the distal portion.  After significant manipulation and inability to locate/remove the Nexplanon, excision was extended slightly.  The Nexplanon rod was then located and grasped using foreceps and removed. There was minimal blood loss. 2 single interrupted sutures were place to close the wound (5-0 nylon).  The patient tolerated the procedure well and was given post procedure instructions.  Assessment/Plan:  1) Nexplanon removal  Patient tolerated the procedure well.   Return for suture removal (given larger incision) in 7 days.  Routine wound care.  Advised previously about contraceptive options.  Everlene OtherJayce Kaz Auld DO [facility] [place]

## 2016-07-19 NOTE — Patient Instructions (Signed)
Routine care.  Sutures out in 1 week.  Take care  Dr. Adriana Simasook

## 2016-07-26 ENCOUNTER — Ambulatory Visit: Payer: 59 | Admitting: Family Medicine

## 2016-08-21 ENCOUNTER — Telehealth: Payer: Self-pay | Admitting: Family Medicine

## 2016-08-21 NOTE — Telephone Encounter (Signed)
Pt called and stated that she had her birth control taken out on 3/12 and was having periods at that time, now for the last 6 weeks has not had her period. Pt wants to know if this is normal or does she need to come in for a pregnancy test. Please advise, thank you!  Call pt @ [phone]

## 2016-08-21 NOTE — Telephone Encounter (Addendum)
Pt called and stated that she had her  Nexplanon birth control taken out on 3/12 and was having periods at that time, now for the last 6 weeks has not had her period. Pt wants to know if this is normal or does she need to come in for a pregnancy test.  Spoke with patient states she took home pregnancy test and it was negative.  She is wondering if she could come in and take a pregnancy test. Patient aware your out of office today please advise.  Call @ [phone]

## 2016-08-21 NOTE — Telephone Encounter (Signed)
It may take some time for her menstrual cycle to re-acclimate, though I would suggest having a pregnancy test completed to make sure she is not pregnant.

## 2016-08-22 NOTE — Telephone Encounter (Signed)
Patient advised of below and verbalzied and understanding .  She is scheduled for appointment on 08/31/16.

## 2016-08-31 ENCOUNTER — Encounter: Payer: Self-pay | Admitting: Family Medicine

## 2016-08-31 ENCOUNTER — Telehealth: Payer: Self-pay

## 2016-08-31 ENCOUNTER — Ambulatory Visit (INDEPENDENT_AMBULATORY_CARE_PROVIDER_SITE_OTHER): Payer: 59 | Admitting: Family Medicine

## 2016-08-31 VITALS — BP 114/86 | HR 82 | Temp 98.4°F | Wt 193.5 lb

## 2016-08-31 DIAGNOSIS — N912 Amenorrhea, unspecified: Secondary | ICD-10-CM

## 2016-08-31 LAB — POCT URINE PREGNANCY: Preg Test, Ur: NEGATIVE

## 2016-08-31 NOTE — Telephone Encounter (Addendum)
CRITICAL VALUE STICKERLess than 1    CRITICAL VALUE: STAT Beta HCG  RECEIVER (on-site recipient of call):Kristen  DATE & TIME NOTIFIED: 08/31/16 3:37  MESSENGER (representative from lab): Joy  MD NOTIFIED: Adriana Simasook   TIME OF NOTIFICATION:3:37  RESPONSE:

## 2016-08-31 NOTE — Progress Notes (Signed)
Subjective:  Patient ID: Linda Dominguez, female    DOB: Sep 17, 1990  Age: 26 y.o. MRN: [id number]  CC: Amenorrhea  HPI:  26 year old female presents with complaints of amenorrhea.  Patient previously had a nexplanon. This was removed at her request. She states that she has always had regular periods. Since the nexplanon has been removed, she has missed 2 periods. She is currently having regular unprotected.  She states she is not attempting to conceive but is not preventing conception. She states that she has no other symptoms. No nausea, no vomiting, abdominal pain. She has no other complaints or concerns at this time. She states that she took one pregnancy test which was negative at home.  Social Hx   Social History   Social History  . Marital status: Single    Spouse name: N/A  . Number of children: N/A  . Years of education: N/A   Social History Main Topics  . Smoking status: Never Smoker  . Smokeless tobacco: Never Used  . Alcohol use 0.0 oz/week     Comment: Socially   . Drug use: No  . Sexual activity: Not Currently    Partners: Male    Birth control/ protection: Implant   Other Topics Concern  . 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   Genitourinary:       Missed periods.   Objective:  BP 114/86   Pulse 82   Temp 98.4 F (36.9 C) (Oral)   Wt 193 lb 8 oz (87.8 kg)   SpO2 97%   BMI 32.20 kg/m   BP/Weight 08/31/2016 07/19/2016 06/28/2016  Systolic BP 114 116 101  Diastolic BP 86 80 71  Wt. (Lbs) 193.5 196 190.2  BMI 32.2 32.62 31.65    Physical Exam  Constitutional: She is oriented to person, place, and time. She appears well-developed. No distress.  Cardiovascular: Normal rate and regular rhythm.   Pulmonary/Chest: Effort normal and breath sounds normal.  Neurological: She is alert and oriented to person, place,  and time.  Psychiatric: She has a normal mood and affect.  Vitals reviewed.   Lab Results  Component Value Date   WBC 5.0 06/27/2015   HGB 13.4 06/27/2015   HCT 39.6 06/27/2015   PLT 319.0 06/27/2015   GLUCOSE 84 06/27/2015   CHOL 163 06/27/2015   TRIG 41.0 06/27/2015   HDL 45.80 06/27/2015   LDLCALC 109 (H) 06/27/2015   ALT 23 06/27/2015   AST 19 06/27/2015   NA 136 06/27/2015   K 4.1 06/27/2015   CL 103 06/27/2015   CREATININE 0.65 06/27/2015   BUN 12 06/27/2015   CO2 25 06/27/2015   TSH 3.67 06/27/2015    Assessment & Plan:   Problem List Items Addressed This Visit    Amenorrhea - Primary    New problem. Does not yet meet criteria for secondary amenorrhea as it has not been 3 months. Urine pregnancy test was negative 2 today. Uncertain etiology at this time. Additional labs today including a quantitative beta-hCG. Referring to GYN.      Relevant Orders   POCT urine pregnancy   CBC   Comprehensive metabolic panel   TSH   Beta HCG, Quant     Follow-up: PRN  Everlene OtherJayce Tikia Skilton DO [facility] [place]

## 2016-08-31 NOTE — Patient Instructions (Signed)
We will call with the results and with the referral.  Take care  Dr. Halo Laski  

## 2016-08-31 NOTE — Progress Notes (Signed)
Pre visit review using our clinic review tool, if applicable. No additional management support is needed unless otherwise documented below in the visit note. 

## 2016-08-31 NOTE — Assessment & Plan Note (Signed)
New problem. Does not yet meet criteria for secondary amenorrhea as it has not been 3 months. Urine pregnancy test was negative 2 today. Uncertain etiology at this time. Additional labs today including a quantitative beta-hCG. Referring to GYN.

## 2016-09-01 LAB — COMPREHENSIVE METABOLIC PANEL
A/G RATIO: 1.6 (ref 1.2–2.2)
ALBUMIN: 4.5 g/dL (ref 3.5–5.5)
ALK PHOS: 65 IU/L (ref 39–117)
ALT: 27 IU/L (ref 0–32)
AST: 21 IU/L (ref 0–40)
BUN / CREAT RATIO: 15 (ref 9–23)
BUN: 13 mg/dL (ref 6–20)
Bilirubin Total: 0.3 mg/dL (ref 0.0–1.2)
CO2: 22 mmol/L (ref 18–29)
CREATININE: 0.88 mg/dL (ref 0.57–1.00)
Calcium: 9 mg/dL (ref 8.7–10.2)
Chloride: 102 mmol/L (ref 96–106)
GFR calc Af Amer: 106 mL/min/{1.73_m2} (ref >59)
GFR, EST NON AFRICAN AMERICAN: 92 mL/min/{1.73_m2} (ref >59)
GLOBULIN, TOTAL: 2.8 g/dL (ref 1.5–4.5)
Glucose: 80 mg/dL (ref 65–99)
POTASSIUM: 4.1 mmol/L (ref 3.5–5.2)
SODIUM: 142 mmol/L (ref 134–144)
Total Protein: 7.3 g/dL (ref 6.0–8.5)

## 2016-09-01 LAB — CBC
Hematocrit: 36.8 % (ref 34.0–46.6)
Hemoglobin: 12.8 g/dL (ref 11.1–15.9)
MCH: 29.9 pg (ref 26.6–33.0)
MCHC: 34.8 g/dL (ref 31.5–35.7)
MCV: 86 fL (ref 79–97)
Platelets: 297 10*3/uL (ref 150–379)
RBC: 4.28 x10E6/uL (ref 3.77–5.28)
RDW: 12.7 % (ref 12.3–15.4)
WBC: 4.1 10*3/uL (ref 3.4–10.8)

## 2016-09-01 LAB — TSH: TSH: 5.65 u[IU]/mL — ABNORMAL HIGH (ref 0.450–4.500)

## 2016-09-01 LAB — BETA HCG QUANT (REF LAB): hCG Quant: <1 m[IU]/mL

## 2016-09-03 ENCOUNTER — Other Ambulatory Visit: Payer: Self-pay | Admitting: Family Medicine

## 2016-09-03 ENCOUNTER — Encounter: Payer: Self-pay | Admitting: Family Medicine

## 2016-09-03 DIAGNOSIS — N912 Amenorrhea, unspecified: Secondary | ICD-10-CM

## 2016-09-14 ENCOUNTER — Other Ambulatory Visit (INDEPENDENT_AMBULATORY_CARE_PROVIDER_SITE_OTHER): Payer: 59

## 2016-09-14 DIAGNOSIS — N912 Amenorrhea, unspecified: Secondary | ICD-10-CM

## 2016-09-14 NOTE — Addendum Note (Signed)
Addended by: Penne LashWIGGINS, Marquerite Forsman N on: 09/14/2016 02:59 PM   Modules accepted: Orders

## 2016-09-15 LAB — TSH: TSH: 4.52 u[IU]/mL — ABNORMAL HIGH (ref 0.450–4.500)

## 2016-09-15 LAB — T4, FREE: Free T4: 1.25 ng/dL (ref 0.82–1.77)

## 2016-09-15 LAB — T3, FREE: T3, Free: 3.3 pg/mL (ref 2.0–4.4)

## 2016-09-16 ENCOUNTER — Encounter (HOSPITAL_COMMUNITY): Payer: Self-pay

## 2016-09-16 ENCOUNTER — Emergency Department (HOSPITAL_COMMUNITY)
Admission: EM | Admit: 2016-09-16 | Discharge: 2016-09-16 | Disposition: A | Discharge status: Home / Self Care | Payer: 59 | Attending: Emergency Medicine | Admitting: Emergency Medicine

## 2016-09-16 DIAGNOSIS — F10121 Alcohol abuse with intoxication delirium: Secondary | ICD-10-CM | POA: Insufficient documentation

## 2016-09-16 DIAGNOSIS — Z818 Family history of other mental and behavioral disorders: Secondary | ICD-10-CM | POA: Diagnosis not present

## 2016-09-16 DIAGNOSIS — F10921 Alcohol use, unspecified with intoxication delirium: Secondary | ICD-10-CM

## 2016-09-16 DIAGNOSIS — Z79899 Other long term (current) drug therapy: Secondary | ICD-10-CM | POA: Diagnosis not present

## 2016-09-16 DIAGNOSIS — R45851 Suicidal ideations: Secondary | ICD-10-CM | POA: Insufficient documentation

## 2016-09-16 DIAGNOSIS — F1010 Alcohol abuse, uncomplicated: Secondary | ICD-10-CM | POA: Diagnosis not present

## 2016-09-16 LAB — COMPREHENSIVE METABOLIC PANEL
ALBUMIN: 3.8 g/dL (ref 3.5–5.0)
ALT: 35 U/L (ref 14–54)
AST: 62 U/L — AB (ref 15–41)
Alkaline Phosphatase: 55 U/L (ref 38–126)
Anion gap: 12 (ref 5–15)
BUN: 7 mg/dL (ref 6–20)
CHLORIDE: 110 mmol/L (ref 101–111)
CO2: 20 mmol/L — ABNORMAL LOW (ref 22–32)
CREATININE: 0.81 mg/dL (ref 0.44–1.00)
Calcium: 8.5 mg/dL — ABNORMAL LOW (ref 8.9–10.3)
GFR calc Af Amer: >60 mL/min (ref >60)
GFR calc non Af Amer: >60 mL/min (ref >60)
GLUCOSE: 101 mg/dL — AB (ref 65–99)
POTASSIUM: 3.7 mmol/L (ref 3.5–5.1)
Sodium: 142 mmol/L (ref 135–145)
Total Bilirubin: 0.3 mg/dL (ref 0.3–1.2)
Total Protein: 6.8 g/dL (ref 6.5–8.1)

## 2016-09-16 LAB — CBC WITH DIFFERENTIAL/PLATELET
Basophils Absolute: 0 10*3/uL (ref 0.0–0.1)
Basophils Relative: 0 %
EOS PCT: 1 %
Eosinophils Absolute: 0.1 10*3/uL (ref 0.0–0.7)
HEMATOCRIT: 34.6 % — AB (ref 36.0–46.0)
Hemoglobin: 11.5 g/dL — ABNORMAL LOW (ref 12.0–15.0)
LYMPHS ABS: 1.9 10*3/uL (ref 0.7–4.0)
LYMPHS PCT: 41 %
MCH: 28.9 pg (ref 26.0–34.0)
MCHC: 33.2 g/dL (ref 30.0–36.0)
MCV: 86.9 fL (ref 78.0–100.0)
MONO ABS: 0.4 10*3/uL (ref 0.1–1.0)
MONOS PCT: 8 %
NEUTROS ABS: 2.3 10*3/uL (ref 1.7–7.7)
Neutrophils Relative %: 50 %
PLATELETS: 265 10*3/uL (ref 150–400)
RBC: 3.98 MIL/uL (ref 3.87–5.11)
RDW: 12.5 % (ref 11.5–15.5)
WBC: 4.7 10*3/uL (ref 4.0–10.5)

## 2016-09-16 LAB — RAPID URINE DRUG SCREEN, HOSP PERFORMED
Amphetamines: NOT DETECTED
BARBITURATES: NOT DETECTED
BENZODIAZEPINES: NOT DETECTED
Cocaine: NOT DETECTED
Opiates: NOT DETECTED
Tetrahydrocannabinol: NOT DETECTED

## 2016-09-16 LAB — ETHANOL: Alcohol, Ethyl (B): 167 mg/dL — ABNORMAL HIGH (ref <5)

## 2016-09-16 LAB — ACETAMINOPHEN LEVEL

## 2016-09-16 LAB — SALICYLATE LEVEL

## 2016-09-16 MED ORDER — IBUPROFEN 200 MG PO TABS: 600.0000 mg | ORAL_TABLET | Freq: Three times a day (TID) | ORAL | Status: DC | PRN

## 2016-09-16 MED ORDER — ZOLPIDEM TARTRATE 5 MG PO TABS: 5.0000 mg | ORAL_TABLET | Freq: Every evening | ORAL | Status: DC | PRN

## 2016-09-16 MED ORDER — NICOTINE 21 MG/24HR TD PT24: 21.0000 mg | MEDICATED_PATCH | Freq: Every day | TRANSDERMAL | Status: DC | PRN

## 2016-09-16 MED ORDER — ALUM & MAG HYDROXIDE-SIMETH 200-200-20 MG/5ML PO SUSP: 30.0000 mL | ORAL | Status: DC | PRN

## 2016-09-16 MED ORDER — ZIPRASIDONE MESYLATE 20 MG IM SOLR
20.0000 mg | Freq: Once | INTRAMUSCULAR | Status: AC
  Administered 2016-09-16: 20 mg via INTRAMUSCULAR

## 2016-09-16 MED ORDER — ONDANSETRON HCL 4 MG PO TABS: 4.0000 mg | ORAL_TABLET | Freq: Three times a day (TID) | ORAL | Status: DC | PRN

## 2016-09-16 MED ORDER — ACETAMINOPHEN 325 MG PO TABS: 650.0000 mg | ORAL_TABLET | ORAL | Status: DC | PRN

## 2016-09-16 NOTE — ED Notes (Signed)
Contact information Timothy Lasso- Zack (Husband) - [phone]. Kathie RhodesBetty (Mother) - [phone]

## 2016-09-16 NOTE — Progress Notes (Signed)
Detailed discussion with husband, Mr. Dorise HissCoggins [phone]) demonstrates that he feels safe with his wife coming home, stating that it was a transient alteration of her mood secondary to acute alcohol intoxication rather than overt severe depression. Mr. Dorise HissCoggins is in agreement to secure any sharps, weapons, dangerous items, and medications in the home until he is sure that she us cognitively functional at her baseline. Husband in agreement to call 911 in the event of any self-injurious behaviors, ideations, or gestures. He agrees to help her get to outpatient appointments via resources received from ED nursing staff.  Delila PereyraJustina A Okonkwo, NP 09/16/2016 4:43 PM

## 2016-09-16 NOTE — ED Notes (Signed)
TTS consult in progress. °

## 2016-09-16 NOTE — Discharge Instructions (Signed)
Follow-up for counseling about alcohol abuse, and suicidal ideation with one of the treatment centers listed, as soon as possible.  Return to the emergency department, if needed, for problems.

## 2016-09-16 NOTE — Consult Note (Signed)
Telepsych Consultation   Reason for Consult:  Consult for a 26yo here for suicide ideation following alcohol intoxication Referring Physician:  EDP Patient Identification: Linda Dominguez MRN:  [id number] Principal Diagnosis: <principal problem not specified> Diagnosis:   Patient Active Problem List   Diagnosis Date Noted  . Amenorrhea [N91.2] 08/31/2016  . Neck pain [M54.2] 06/28/2016  . Contraception management [Z30.9] 06/28/2016    Total Time spent with patient: 30 minutes  Subjective:   Linda Dominguez is a 26 y.o. female patient admitted with suicide ideation with a plan to tie her towel around her neck as well as her bathrobe.  HPI: Per documentation by Graciella Freer on 09/16/16, "Patient is a 26 year old that presents to the ED with her husband due to suicidal ideation with a plan tie her towel around her neck as well as her bathrobe.   Patient reports that her father attempted to kill himself 10 years ago. During the assessment, patient denies wanting to harm herself.    Patient then denied SI/HI/Psychosis/Substance Abuse; however, patient BAL is 167.  Patient reports that she was drinking because she was upset.  Patient denies chronic alcohol abuse.   Per chart review that patient was yelling, screaming and had to be placed in four point restrained.  Patient restraints were removed at 0255.  Patient denies prior inpatient psychiatric hospitalization.  Patient denies outpatient psychiatric medication management or outpatient therapy.   Patient reports increased depression associated with not being able to spend a lot of time with her husband spending time with her husband due to their work schedules.  Patient denies physical, sexual or emotional abuse.  Patient reports that she resides with her husband and they have been married for five years.   Patient reports that she is employed and they do not have any children".   On Exam: Pt appears in a euthymic mood and pleasant affect.  She reiterated the her reason for this admission as outlined above. Pt said she regretted her action and will not do anything to hurt herself. Pt reports that she was just upset from not being able to spend time with her husband due to their conflicting work schedules. She said she works 5days a week at [employer] currently denying any SI/HI/VAH. Said she had drank a lot of wine yesterday and became drunk from it.   Past Psychiatric History: None  Risk to Self: Suicidal Ideation: Yes-Currently Present Suicidal Intent: Yes-Currently Present Is patient at risk for suicide?: Yes Suicidal Plan?: Yes-Currently Present Specify Current Suicidal Plan: Hang herself Access to Means: Yes Specify Access to Suicidal Means: Towel or Bathrobe What has been your use of drugs/alcohol within the last 12 months?: Alcohol How many times?: 0 Other Self Harm Risks: None Reported Triggers for Past Attempts:  (NA) Intentional Self Injurious Behavior: None Risk to Others: Homicidal Ideation: No Thoughts of Harm to Others: No Current Homicidal Intent: No Current Homicidal Plan: No Access to Homicidal Means: No Identified Victim: NA History of harm to others?: No Assessment of Violence: None Noted Violent Behavior Description: NA Does patient have access to weapons?: No Criminal Charges Pending?: No Does patient have a court date: No Prior Inpatient Therapy: Prior Inpatient Therapy: No Prior Therapy Dates: NA Prior Therapy Facilty/Provider(s): NA Reason for Treatment: NA Prior Outpatient Therapy: Prior Outpatient Therapy: No Prior Therapy Dates: NA Prior Therapy Facilty/Provider(s): NA Reason for Treatment: NA Does patient have an ACCT team?: No Does patient have Intensive In-House Services?  : No  Does patient have [facility] services? : No Does patient have P4[facility] services?: No  Past Medical History:  Past Medical History:  Diagnosis Date  . Concussion    She's had 3, last one was when she was 20     Past Surgical History:  Procedure Laterality Date  . COLONOSCOPY WITH PROPOFOL N/A 05/06/2015   Procedure: COLONOSCOPY WITH PROPOFOL;  Surgeon: Hulen Luster, MD;  Location[facility];  Service: Gastroenterology;  Laterality: N/A;  . TONSILLECTOMY AND ADENOIDECTOMY  1995   Family History:  Family History  Problem Relation Age of Onset  . Depression Father   . Mental illness Father        Attempted Suicide  . Alcohol abuse Maternal Grandmother   . Hyperlipidemia Maternal Grandmother   . Heart disease Maternal Grandmother   . Diabetes Maternal Grandmother   . Heart disease Maternal Grandfather   . Stroke Maternal Grandfather   . Diabetes Maternal Grandfather   . Early death Paternal Grandmother    Family Psychiatric  History: Unknown Social History:  History  Alcohol Use  . 0.0 oz/week    Comment: Socially      History  Drug Use No    Social History   Social History  . Marital status: Single    Spouse name: N/A  . Number of children: N/A  . Years of education: N/A   Social History Main Topics  . Smoking status: Never Smoker  . Smokeless tobacco: Never Used  . Alcohol use 0.0 oz/week     Comment: Socially   . Drug use: No  . Sexual activity: Not Currently    Partners: Male    Birth control/ protection: Implant   Other Topics Concern  . 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        Additional Social History:    Allergies:  No Known Allergies  Labs:  Results for orders placed or performed during the hospital encounter of 09/16/16 (from the past 48 hour(s))  Comprehensive metabolic panel     Status: Abnormal   Collection Time: 09/16/16  3:04 AM  Result Value Ref Range   Sodium 142 135 - 145 mmol/L   Potassium 3.7 3.5 - 5.1 mmol/L   Chloride 110 101 - 111 mmol/L   CO2 20 (L) 22 - 32 mmol/L   Glucose, Bld 101 (H) 65 - 99 mg/dL   BUN 7 6 - 20 mg/dL   Creatinine, Ser  0.81 0.44 - 1.00 mg/dL   Calcium 8.5 (L) 8.9 - 10.3 mg/dL   Total Protein 6.8 6.5 - 8.1 g/dL   Albumin 3.8 3.5 - 5.0 g/dL   AST 62 (H) 15 - 41 U/L   ALT 35 14 - 54 U/L   Alkaline Phosphatase 55 38 - 126 U/L   Total Bilirubin 0.3 0.3 - 1.2 mg/dL   GFR calc non Af Amer >60 >60 mL/min   GFR calc Af Amer >60 >60 mL/min    Comment: (NOTE) The eGFR has been calculated using the CKD EPI equation. This calculation has not been validated in all clinical situations. eGFR's persistently <60 mL/min signify possible Chronic Kidney Disease.    Anion gap 12 5 - 15  Ethanol     Status: Abnormal   Collection Time: 09/16/16  3:04 AM  Result Value Ref Range   Alcohol, Ethyl (B) 167 (H) <5 mg/dL  Comment:        LOWEST DETECTABLE LIMIT FOR SERUM ALCOHOL IS 5 mg/dL FOR MEDICAL PURPOSES ONLY   CBC with Diff     Status: Abnormal   Collection Time: 09/16/16  3:04 AM  Result Value Ref Range   WBC 4.7 4.0 - 10.5 K/uL   RBC 3.98 3.87 - 5.11 MIL/uL   Hemoglobin 11.5 (L) 12.0 - 15.0 g/dL   HCT 34.6 (L) 36.0 - 46.0 %   MCV 86.9 78.0 - 100.0 fL   MCH 28.9 26.0 - 34.0 pg   MCHC 33.2 30.0 - 36.0 g/dL   RDW 12.5 11.5 - 15.5 %   Platelets 265 150 - 400 K/uL   Neutrophils Relative % 50 %   Neutro Abs 2.3 1.7 - 7.7 K/uL   Lymphocytes Relative 41 %   Lymphs Abs 1.9 0.7 - 4.0 K/uL   Monocytes Relative 8 %   Monocytes Absolute 0.4 0.1 - 1.0 K/uL   Eosinophils Relative 1 %   Eosinophils Absolute 0.1 0.0 - 0.7 K/uL   Basophils Relative 0 %   Basophils Absolute 0.0 0.0 - 0.1 K/uL  Acetaminophen level     Status: Abnormal   Collection Time: 09/16/16  3:04 AM  Result Value Ref Range   Acetaminophen (Tylenol), Serum <10 (L) 10 - 30 ug/mL    Comment:        THERAPEUTIC CONCENTRATIONS VARY SIGNIFICANTLY. A RANGE OF 10-30 ug/mL MAY BE AN EFFECTIVE CONCENTRATION FOR MANY PATIENTS. HOWEVER, SOME ARE BEST TREATED AT CONCENTRATIONS OUTSIDE THIS RANGE. ACETAMINOPHEN CONCENTRATIONS >150 ug/mL AT 4 HOURS  AFTER INGESTION AND >50 ug/mL AT 12 HOURS AFTER INGESTION ARE OFTEN ASSOCIATED WITH TOXIC REACTIONS.   Salicylate level     Status: None   Collection Time: 09/16/16  3:04 AM  Result Value Ref Range   Salicylate Lvl <6.9 2.8 - 30.0 mg/dL  Urine rapid drug screen (hosp performed)not at [facility]     Status: None   Collection Time: 09/16/16  1:30 PM  Result Value Ref Range   Opiates NONE DETECTED NONE DETECTED   Cocaine NONE DETECTED NONE DETECTED   Benzodiazepines NONE DETECTED NONE DETECTED   Amphetamines NONE DETECTED NONE DETECTED   Tetrahydrocannabinol NONE DETECTED NONE DETECTED   Barbiturates NONE DETECTED NONE DETECTED    Comment:        DRUG SCREEN FOR MEDICAL PURPOSES ONLY.  IF CONFIRMATION IS NEEDED FOR ANY PURPOSE, NOTIFY LAB WITHIN 5 DAYS.        LOWEST DETECTABLE LIMITS FOR URINE DRUG SCREEN Drug Class       Cutoff (ng/mL) Amphetamine      1000 Barbiturate      200 Benzodiazepine   629 Tricyclics       528 Opiates          300 Cocaine          300 THC              50     Current Facility-Administered Medications  Medication Dose Route Frequency Provider Last Rate Last Dose  . acetaminophen (TYLENOL) tablet 650 mg  650 mg Oral Q4H PRN Daleen Bo, MD      . alum & mag hydroxide-simeth (MAALOX/MYLANTA) 200-200-20 MG/5ML suspension 30 mL  30 mL Oral PRN Daleen Bo, MD      . ibuprofen (ADVIL,MOTRIN) tablet 600 mg  600 mg Oral Q8H PRN Daleen Bo, MD      . nicotine (NICODERM CQ - dosed in mg/24 hours)  patch 21 mg  21 mg Transdermal Daily PRN Daleen Bo, MD      . ondansetron [facility]) tablet 4 mg  4 mg Oral Q8H PRN Daleen Bo, MD      . zolpidem (AMBIEN) tablet 5 mg  5 mg Oral QHS PRN Daleen Bo, MD       No current outpatient prescriptions on file.    Musculoskeletal: UTA via telepsych  Psychiatric Specialty Exam: Physical Exam  Nursing note and vitals reviewed.   Review of Systems  Psychiatric/Behavioral: Positive for depression  (transient depression due to family dynamics) and substance abuse (alcohol intoxication). Negative for hallucinations. The patient is not nervous/anxious and does not have insomnia.     Blood pressure 120/66, pulse 77, temperature 98.2 F (36.8 C), temperature source Oral, resp. rate 18, SpO2 98 %.There is no height or weight on file to calculate BMI.  General Appearance: Pt on a hospital gown  Eye Contact:  Good  Speech:  Clear and Coherent and Normal Rate  Volume:  Normal  Mood:  Euthymic  Affect:  Appropriate  Thought Process:  Coherent  Orientation:  Full (Time, Place, and Person)  Thought Content:  WDL and Logical  Suicidal Thoughts:  No  Homicidal Thoughts:  No  Memory:  Immediate;   Good Recent;   Good Remote;   Good  Judgement:  Good  Insight:  Present  Psychomotor Activity:  Normal  Concentration:  Concentration: Good and Attention Span: Good  Recall:  Good  Fund of Knowledge:  Good  Language:  Good  Akathisia:  Negative  Handed:  Right  AIMS (if indicated):     Assets:  Communication Skills Desire for Improvement Financial Resources/Insurance Housing Intimacy  ADL's:  Intact  Cognition:  WNL  Sleep:        Treatment Plan Summary: Daily contact with patient to assess and evaluate symptoms and progress in treatment and Medication management  OP services to be provided to patient at this time.  Disposition: No evidence of imminent risk to self or others at present.   Patient does not meet criteria for psychiatric inpatient admission. Patient does not appear to be a threat to self or others at this time and given patients history and husbands collatera, patient will be discharge with OP resources as she has agreed to follow up as recommended.   Vicenta Aly, NP 09/16/2016 4:47 PM

## 2016-09-16 NOTE — ED Notes (Signed)
Restraints removed.

## 2016-09-16 NOTE — ED Notes (Signed)
Family was informed that patient would be resting here until she could be evaluated in the morning around or after 0800. Instructed the family to call and speak with attending nurse prior to showing up to visit. Family's number were gotten and charted for reference

## 2016-09-16 NOTE — ED Triage Notes (Signed)
Pt brought in [place] by husband. Pt met at front of ED. Pt upset and tearful stating that she wants to commit suicide. Per husband ETOH on board. Per husband pt is upset that husband is working too much and is never home. Pt non cooperative with staff.

## 2016-09-16 NOTE — ED Notes (Signed)
IVC papers rescinded by Dr Effie ShyWentz [facility] copy sent to [facility] records, and original placed in folder for [employer].

## 2016-09-16 NOTE — ED Notes (Signed)
Dinner tray ordered; regular diet, no sharps 

## 2016-09-16 NOTE — BH Assessment (Addendum)
Tele Assessment Note  Patient is a 26 year old that presents to the ED with her husband due to suicidal ideation with a plan tie her towel around her neck as well as her bathrobe.   Patient reports that her father attempted to kill himself 10 years ago. During the assessment, patient denies wanting to harm herself.    Patient then denied SI/HI/Psychosis/Substance Abuse; however, patient BAL is 167.  Patient reports that she was drinking because she was upset.  Patient denies chronic alcohol abuse.   Per chart review that patient was yelling, screaming and had to be placed in four point restrained.  Patient restraints were removed at 0255.  Patient denies prior inpatient psychiatric hospitalization.  Patient denies outpatient psychiatric medication management or outpatient therapy.   Patient reports increased depression associated with not being able to spend a lot of time with her husband spending time with her husband due to their work schedules.  Patient denies physical, sexual or emotional abuse.  Patient reports that she resides with her husband and they have been married for five years.   Patient reports that she is employed and they do not have any children.     Diagnosis: Major Depressive Disorder   Past Medical History:  Past Medical History:  Diagnosis Date  . Concussion    She's had 3, last one was when she was 20    Past Surgical History:  Procedure Laterality Date  . COLONOSCOPY WITH PROPOFOL N/A 05/06/2015   Procedure: COLONOSCOPY WITH PROPOFOL;  Surgeon: Wallace Cullens, MD;  Location: [facility] ENDOSCOPY;  Service: Gastroenterology;  Laterality: N/A;  . TONSILLECTOMY AND ADENOIDECTOMY  1995    Family History:  Family History  Problem Relation Age of Onset  . Depression Father   . Mental illness Father        Attempted Suicide  . Alcohol abuse Maternal Grandmother   . Hyperlipidemia Maternal Grandmother   . Heart disease Maternal Grandmother   . Diabetes Maternal Grandmother   .  Heart disease Maternal Grandfather   . Stroke Maternal Grandfather   . Diabetes Maternal Grandfather   . Early death Paternal Grandmother     Social History:  reports that she has never smoked. She has never used smokeless tobacco. She reports that she drinks alcohol. She reports that she does not use drugs.  Additional Social History:  Alcohol / Drug Use History of alcohol / drug use?: No history of alcohol / drug abuse  CIWA: CIWA-Ar BP: 115/70 Pulse Rate: 94 COWS:    PATIENT STRENGTHS: (choose at least two) Average or above average intelligence Capable of independent living Psychologist, counselling means Physical Health Supportive family/friends Work skills  Allergies: No Known Allergies  Home Medications:  (Not in a hospital admission)  OB/GYN Status:  No LMP recorded.  General Assessment Data Location of Assessment: [facility] ED TTS Assessment: In system Is this a Tele or Face-to-Face Assessment?: Tele Assessment Is this an Initial Assessment or a Re-assessment for this encounter?: Initial Assessment Marital status: Single Maiden name: NA Is patient pregnant?: No Pregnancy Status: No Living Arrangements: Spouse/significant other Can pt return to current living arrangement?: Yes Admission Status: Voluntary Is patient capable of signing voluntary admission?: Yes Referral Source: Self/Family/Friend Insurance type: [facility]  Medical Screening Exam [facility] Walk-in ONLY) Medical Exam completed:  (NA)  Crisis Care Plan Living Arrangements: Spouse/significant other Legal Guardian:  (NA) Name of Psychiatrist: None Reported Name of Therapist: None Reported  Education Status Is patient currently in school?:  No Current Grade: NA Highest grade of school patient has completed: NA Name of school: NA Contact person: NA  Risk to self with the past 6 months Suicidal Ideation: Yes-Currently Present Has patient been a risk to self within the past 6 months prior to admission?  : Yes Suicidal Intent: Yes-Currently Present Has patient had any suicidal intent within the past 6 months prior to admission? : Yes Is patient at risk for suicide?: Yes Suicidal Plan?: Yes-Currently Present Has patient had any suicidal plan within the past 6 months prior to admission? : Yes Specify Current Suicidal Plan: Sherri RadHang herself Access to Means: Yes Specify Access to Suicidal Means: Towel or Bathrobe What has been your use of drugs/alcohol within the last 12 months?: Alcohol Previous Attempts/Gestures: No How many times?: 0 Other Self Harm Risks: None Reported Triggers for Past Attempts:  (NA) Intentional Self Injurious Behavior: None Family Suicide History: No Recent stressful life event(s): Conflict (Comment) (Strained relationship with husband ) Persecutory voices/beliefs?: No Depression: Yes Depression Symptoms: Despondent, Insomnia, Tearfulness, Isolating, Fatigue, Guilt, Loss of interest in usual pleasures, Feeling angry/irritable, Feeling worthless/self pity Substance abuse history and/or treatment for substance abuse?: No Suicide prevention information given to non-admitted patients: Yes  Risk to Others within the past 6 months Homicidal Ideation: No Does patient have any lifetime risk of violence toward others beyond the six months prior to admission? : No Thoughts of Harm to Others: No Current Homicidal Intent: No Current Homicidal Plan: No Access to Homicidal Means: No Identified Victim: NA History of harm to others?: No Assessment of Violence: None Noted Violent Behavior Description: NA Does patient have access to weapons?: No Criminal Charges Pending?: No Does patient have a court date: No Is patient on probation?: No  Psychosis Hallucinations: None noted Delusions: None noted  Mental Status Report Appearance/Hygiene: In scrubs Eye Contact: Poor Motor Activity: Freedom of movement Speech: Logical/coherent, Soft Level of Consciousness: Alert,  Restless Mood: Depressed, Despair Affect: Blunted Anxiety Level: Minimal Thought Processes: Coherent, Relevant Judgement: Unimpaired Orientation: Person, Place, Time, Situation Obsessive Compulsive Thoughts/Behaviors: None  Cognitive Functioning Concentration: Decreased Memory: Recent Intact, Remote Intact IQ: Average Insight: Poor Impulse Control: Poor Appetite: Fair Weight Loss: 0 Weight Gain: 0 Sleep: Decreased Total Hours of Sleep: 3 Vegetative Symptoms: Decreased grooming, Not bathing, Staying in bed  ADLScreening [facility]) Patient's cognitive ability adequate to safely complete daily activities?: Yes Patient able to express need for assistance with ADLs?: Yes Independently performs ADLs?: Yes (appropriate for developmental age)  Prior Inpatient Therapy Prior Inpatient Therapy: No Prior Therapy Dates: NA Prior Therapy Facilty/Provider(s): NA Reason for Treatment: NA  Prior Outpatient Therapy Prior Outpatient Therapy: No Prior Therapy Dates: NA Prior Therapy Facilty/Provider(s): NA Reason for Treatment: NA Does patient have an ACCT team?: No Does patient have Intensive In-House Services?  : No Does patient have [facility] services? : No Does patient have P4CC services?: No  ADL Screening (condition at time of admission) Patient's cognitive ability adequate to safely complete daily activities?: Yes Is the patient deaf or have difficulty hearing?: No Does the patient have difficulty seeing, even when wearing glasses/contacts?: No Does the patient have difficulty concentrating, remembering, or making decisions?: No Patient able to express need for assistance with ADLs?: Yes Does the patient have difficulty dressing or bathing?: No Independently performs ADLs?: Yes (appropriate for developmental age) Does the patient have difficulty walking or climbing stairs?: No Weakness of Legs: None Weakness of Arms/Hands: None  Home Assistive  Devices/Equipment Home Assistive Devices/Equipment: None  Abuse/Neglect Assessment (Assessment to be complete while patient is alone) Physical Abuse: Denies Verbal Abuse: Denies Sexual Abuse: Denies Exploitation of patient/patient's resources: Denies Self-Neglect: Denies Values / Beliefs Cultural Requests During Hospitalization: None Spiritual Requests During Hospitalization: None Consults Spiritual Care Consult Needed: No Advance Directives (For Healthcare) Does Patient Have a Medical Advance Directive?: No Would patient like information on creating a medical advance directive?: No - Patient declined    Additional Information 1:1 In Past 12 Months?: No CIRT Risk: No Elopement Risk: No Does patient have medical clearance?: Yes     Disposition: Per Inetta Fermo, NP - patient will be re-assessed by the NP in the morning for a final disposition.  Disposition Initial Assessment Completed for this Encounter: Yes  Phillip Heal LaVerne 09/16/2016 11:57 AM

## 2016-09-16 NOTE — ED Notes (Addendum)
Pt is calmly resting in bed waiting for parents to arrive to take pt home. Pt has no complaints at this time. Pt used RN phone to call parents.

## 2016-09-16 NOTE — ED Notes (Signed)
Husband at bedside.  

## 2016-09-16 NOTE — ED Triage Notes (Signed)
Pt ready for TTS. 

## 2016-09-16 NOTE — ED Triage Notes (Signed)
TTS in progress 

## 2016-09-16 NOTE — BH Assessment (Signed)
Per tina, NP - patient will be re-assessed in the morning for a final disposition. Writer informed the ER RN Minerva AreolaEric) .

## 2016-09-16 NOTE — BH Assessment (Signed)
Per Leighton Ruffina Okonkwo, NP - patient does not meet criteria for inpatient hospitalization.  Patient will be discharged with outpatient resources.

## 2016-09-16 NOTE — ED Provider Notes (Addendum)
10: 04-patient now awake and lucid and able to communicate, will ask TTS to see her now.   12: 25-she has been seen by TTS who are reluctant to observe her for 24 hours and reassess in the morning.  Awaiting bed, in pod Brayton CavesF   Canesha Tesfaye, MD 09/16/16 1229   The patient was again evaluated by TTS at 15: 49, they feel that she is stable for discharge.  At this time the patient is alert, cooperative.  She states that she is not suicidal or homicidal.  She states that she is comfortable going home, where she lives with her husband, who has visited her here and been supportive.  Also, her parents have been here and they are supportive as well.  They live in the area.  Patient states she is committed to working hard to get better.  Patient has been given resources, for follow-up, and recommendations to follow-up with a therapist as soon as possible.  IVC paperwork, rescinded by me.     Mancel BaleWentz, Melayna Robarts, MD 09/16/16 [phone]

## 2016-09-16 NOTE — ED Provider Notes (Signed)
[facility]-EMERGENCY DEPT Provider Note   CSN: [id number] Arrival date & time: 09/16/16  0153     History   Chief Complaint Chief Complaint  Patient presents with  . Suicidal    HPI Linda Dominguez is a 26 y.o. female.  HPI  Level V caveat for intoxication  Patient presents with her husband with suicidal ideation. History is obtained mostly from the husband as the patient is uncooperative and obviously intoxicated. Husband states that she has had increased stress and anxiety over there work schedules and not seen each other. He states that he went to work today. She called him at midnight and was "obviously drunk." When he got home she he states that she was inconsolable and yelling at him. She subsequently multiple times threaten suicide. She further attempted to tie her towel around her neck as well as her bathrobe tie. No known history of psychiatric disorders. Does report history of anxiety. No known drug use.  On my evaluation, the patient is very tearful and obviously intoxicated. She multiple times states if you let me go home I will kill myself."  She is uncooperative and will not allow staff to assess her.  Past Medical History:  Diagnosis Date  . Concussion    She's had 3, last one was when she was 20    Patient Active Problem List   Diagnosis Date Noted  . Amenorrhea 08/31/2016  . Neck pain 06/28/2016  . Contraception management 06/28/2016    Past Surgical History:  Procedure Laterality Date  . COLONOSCOPY WITH PROPOFOL N/A 05/06/2015   Procedure: COLONOSCOPY WITH PROPOFOL;  Surgeon: Wallace Cullens, MD;  Location: [facility] ENDOSCOPY;  Service: Gastroenterology;  Laterality: N/A;  . TONSILLECTOMY AND ADENOIDECTOMY  1995    OB History    No data available       Home Medications    Prior to Admission medications   Not on File    Family History Family History  Problem Relation Age of Onset  . Depression Father   . Mental illness Father        Attempted Suicide  .  Alcohol abuse Maternal Grandmother   . Hyperlipidemia Maternal Grandmother   . Heart disease Maternal Grandmother   . Diabetes Maternal Grandmother   . Heart disease Maternal Grandfather   . Stroke Maternal Grandfather   . Diabetes Maternal Grandfather   . Early death Paternal Grandmother     Social History Social History  Substance Use Topics  . Smoking status: Never Smoker  . Smokeless tobacco: Never Used  . Alcohol use 0.0 oz/week     Comment: Socially      Allergies   Patient has no known allergies.   Review of Systems Review of Systems  Unable to perform ROS: Psychiatric disorder     Physical Exam Updated Vital Signs BP 110/61 (BP Location: Right Arm)   Pulse 93   Resp 16   SpO2 95%   Physical Exam  Constitutional:  Tearful, disheveled  HENT:  Head: Normocephalic and atraumatic.  Cardiovascular: Normal rate and regular rhythm.   Pulmonary/Chest: Effort normal. No respiratory distress.  Neurological: She is alert.  Moves all 4 extremities equally, normal gait  Skin: Skin is warm and dry.  Psychiatric:  Anxious, tearful, intoxicated  Nursing note and vitals reviewed.    ED Treatments / Results  Labs (all labs ordered are listed, but only abnormal results are displayed) Labs Reviewed  COMPREHENSIVE METABOLIC PANEL - Abnormal; Notable for the following:  Result Value   CO2 20 (*)    Glucose, Bld 101 (*)    Calcium 8.5 (*)    AST 62 (*)    All other components within normal limits  ETHANOL - Abnormal; Notable for the following:    Alcohol, Ethyl (B) 167 (*)    All other components within normal limits  CBC WITH DIFFERENTIAL/PLATELET - Abnormal; Notable for the following:    Hemoglobin 11.5 (*)    HCT 34.6 (*)    All other components within normal limits  ACETAMINOPHEN LEVEL - Abnormal; Notable for the following:    Acetaminophen (Tylenol), Serum <10 (*)    All other components within normal limits  SALICYLATE LEVEL  RAPID URINE DRUG  SCREEN, HOSP PERFORMED    EKG  EKG Interpretation  Date/Time:  Sunday Sep 16 2016 02:56:38 EDT Ventricular Rate:  98 PR Interval:    QRS Duration: 92 QT Interval:  367 QTC Calculation: 469 R Axis:   71 Text Interpretation:  Sinus rhythm Borderline T abnormalities, anterior leads Confirmed by Ross MarcusHorton, Dulcey Riederer [phone]([id number]) on 09/16/2016 3:46:45 AM       Radiology No results found.  Procedures Procedures (including critical care time)  Medications Ordered in ED Medications  ziprasidone (GEODON) injection 20 mg (20 mg Intramuscular Given 09/16/16 0210)     Initial Impression / Assessment and Plan / ED Course  I have reviewed the triage vital signs and the nursing notes.  Pertinent labs & imaging results that were available during my care of the patient were reviewed by me and considered in my medical decision making (see chart for details).     Patient presents obviously intoxicated and suicidal. She is uncooperative and will not allow us to examine her. Given that she is a danger to herself, she was IVC. She was given Geodon. She is currently resting comfortably. Workup notable for blood alcohol level 167. Otherwise reassuring. Patient is medically clear for TTS evaluation when she awakes.  Final Clinical Impressions(s) / ED Diagnoses   Final diagnoses:  Suicidal ideation  Alcohol abuse    New Prescriptions New Prescriptions   No medications on file     Shon BatonHorton, Kimesha Claxton F, MD 09/16/16 [phone]

## 2016-09-19 ENCOUNTER — Encounter: Payer: Self-pay | Admitting: *Deleted

## 2016-09-20 ENCOUNTER — Other Ambulatory Visit: Payer: Self-pay | Admitting: Family Medicine

## 2016-09-20 MED ORDER — LEVOTHYROXINE SODIUM 75 MCG PO TABS: 75.0000 ug | ORAL_TABLET | Freq: Every day | ORAL | 1 refills | Status: DC

## 2016-11-14 LAB — OB RESULTS CONSOLE ABO/RH: RH Type: POSITIVE

## 2016-11-14 LAB — OB RESULTS CONSOLE RPR: RPR: NONREACTIVE

## 2016-11-14 LAB — OB RESULTS CONSOLE GC/CHLAMYDIA
Chlamydia: NEGATIVE
GC PROBE AMP, GENITAL: NEGATIVE

## 2016-11-14 LAB — OB RESULTS CONSOLE HEPATITIS B SURFACE ANTIGEN: Hepatitis B Surface Ag: NEGATIVE

## 2016-11-14 LAB — OB RESULTS CONSOLE RUBELLA ANTIBODY, IGM: Rubella: IMMUNE

## 2016-11-14 LAB — OB RESULTS CONSOLE HIV ANTIBODY (ROUTINE TESTING): HIV: NONREACTIVE

## 2017-03-05 ENCOUNTER — Inpatient Hospital Stay (HOSPITAL_COMMUNITY)
Admission: AD | Admit: 2017-03-05 | Discharge: 2017-03-05 | Disposition: A | Discharge status: Home / Self Care | Source: Ambulatory Visit | Attending: Obstetrics and Gynecology | Admitting: Obstetrics and Gynecology

## 2017-03-05 ENCOUNTER — Encounter (HOSPITAL_COMMUNITY): Payer: Self-pay

## 2017-03-05 DIAGNOSIS — O26899 Other specified pregnancy related conditions, unspecified trimester: Secondary | ICD-10-CM

## 2017-03-05 DIAGNOSIS — E039 Hypothyroidism, unspecified: Secondary | ICD-10-CM | POA: Insufficient documentation

## 2017-03-05 DIAGNOSIS — O99282 Endocrine, nutritional and metabolic diseases complicating pregnancy, second trimester: Secondary | ICD-10-CM | POA: Diagnosis not present

## 2017-03-05 DIAGNOSIS — Z3A25 25 weeks gestation of pregnancy: Secondary | ICD-10-CM | POA: Insufficient documentation

## 2017-03-05 DIAGNOSIS — O26892 Other specified pregnancy related conditions, second trimester: Secondary | ICD-10-CM | POA: Diagnosis not present

## 2017-03-05 DIAGNOSIS — Z79899 Other long term (current) drug therapy: Secondary | ICD-10-CM | POA: Diagnosis not present

## 2017-03-05 DIAGNOSIS — R102 Pelvic and perineal pain: Secondary | ICD-10-CM | POA: Diagnosis not present

## 2017-03-05 DIAGNOSIS — Z818 Family history of other mental and behavioral disorders: Secondary | ICD-10-CM | POA: Diagnosis not present

## 2017-03-05 HISTORY — DX: Hypothyroidism, unspecified: E03.9

## 2017-03-05 LAB — URINALYSIS, ROUTINE W REFLEX MICROSCOPIC
Bilirubin Urine: NEGATIVE
GLUCOSE, UA: NEGATIVE mg/dL
Hgb urine dipstick: NEGATIVE
KETONES UR: NEGATIVE mg/dL
Nitrite: NEGATIVE
PROTEIN: NEGATIVE mg/dL
Specific Gravity, Urine: 1.008 (ref 1.005–1.030)
pH: 8 (ref 5.0–8.0)

## 2017-03-05 LAB — WET PREP, GENITAL
CLUE CELLS WET PREP: NONE SEEN
Sperm: NONE SEEN
Trich, Wet Prep: NONE SEEN
Yeast Wet Prep HPF POC: NONE SEEN

## 2017-03-05 MED ORDER — ACETAMINOPHEN 500 MG PO TABS
1000.0000 mg | ORAL_TABLET | Freq: Once | ORAL | Status: AC
  Administered 2017-03-05: 1000 mg via ORAL
  Filled 2017-03-05: qty 2

## 2017-03-05 NOTE — MAU Note (Signed)
Pt. States that she started having severe abdominal pains bilaterally at 0200 this morning. She describes the pain as sharp and stabbing and rates the pain 8/10 intermittent.   Pt. Tried to drink water to relieve some of the pain with no relief.   Denies vaginal bleeding or leaking.

## 2017-03-05 NOTE — Discharge Instructions (Signed)

## 2017-03-05 NOTE — MAU Provider Note (Signed)
History     CSN: [id number]  Arrival date and time: 03/05/17 [id number]   First Provider Initiated Contact with Patient 03/05/17 [phone]      Chief Complaint  Patient presents with  . Pelvic Pain   Linda Dominguez is a 26 y.o G1P0 at 4653w3d who presents today with bilateral lower abdominal pain since 0200 today. She states that the pain is constant, but with intermittent "stabbing" pains. She rates her pain 8/10. She reports normal fetal movement. She denies any VB or LOF.    Pelvic Pain  The patient's primary symptoms include pelvic pain. The patient's pertinent negatives include no vaginal discharge. This is a new problem. The current episode started today. The problem occurs constantly. The problem has been unchanged. Pain severity now: 8/10. The problem affects both sides. She is pregnant. Associated symptoms include nausea. Pertinent negatives include no chills, constipation (Last BM 03/04/17 at 1800), diarrhea, dysuria, fever, frequency or vomiting. The vaginal discharge was normal. There has been no bleeding. Nothing aggravates the symptoms. She has tried nothing for the symptoms. Sexual activity: pt denies intercourse in the last 24 hours.  Menstrual history: lmp 09/08/16    Past Medical History:  Diagnosis Date  . Concussion    She's had 3, last one was when she was 20  . Hypothyroidism 05/01/2015    Past Surgical History:  Procedure Laterality Date  . TONSILLECTOMY AND ADENOIDECTOMY  1995    Family History  Problem Relation Age of Onset  . Depression Father   . Mental illness Father        Attempted Suicide  . Alcohol abuse Maternal Grandmother   . Hyperlipidemia Maternal Grandmother   . Heart disease Maternal Grandmother   . Diabetes Maternal Grandmother   . Heart disease Maternal Grandfather   . Stroke Maternal Grandfather   . Diabetes Maternal Grandfather   . Early death Paternal Grandmother     Social History   Tobacco Use  . Smoking status: Never Smoker  .  Smokeless tobacco: Never Used  Substance Use Topics  . Alcohol use: No    Alcohol/week: 0.0 oz    Frequency: Never    Comment: not currently  . Drug use: No    Allergies: No Known Allergies  Medications Prior to Admission  Medication Sig Dispense Refill Last Dose  . levothyroxine (SYNTHROID, LEVOTHROID) 75 MCG tablet Take 1 tablet (75 mcg total) by mouth daily. 30 tablet 1     Review of Systems  Constitutional: Negative for chills and fever.  Gastrointestinal: Positive for nausea. Negative for constipation (Last BM 03/04/17 at 1800), diarrhea and vomiting.  Genitourinary: Positive for pelvic pain. Negative for dysuria, frequency, vaginal bleeding and vaginal discharge.   Physical Exam   Blood pressure 107/73, pulse (!) 114, temperature 99.2 F (37.3 C), temperature source Oral, resp. rate 20, height 5\' 4"  (1.626 m), weight 197 lb 8 oz (89.6 kg), last menstrual period 09/08/2016, SpO2 99 %.  Physical Exam  Nursing note and vitals reviewed. Constitutional: She is oriented to person, place, and time. She appears well-developed and well-nourished. No distress.  HENT:  Head: Normocephalic.  Cardiovascular: Normal rate.  Respiratory: Effort normal.  GI: Soft. There is no tenderness. There is no rebound.  Genitourinary:  Genitourinary Comments:  External: no lesion Vagina: small amount of white discharge Cervix: pink, smooth, closed/thick  Uterus: AGA  Neurological: She is alert and oriented to person, place, and time.  Skin: Skin is warm and dry.  Psychiatric: She has  a normal mood and affect.   FHT; 150, moderate with 15x15 accels, no decels Toco: no UCs   Results for orders placed or performed during the hospital encounter of 03/05/17 (from the past 24 hour(s))  Urinalysis, Routine w reflex microscopic     Status: Abnormal   Collection Time: 03/05/17  7:20 AM  Result Value Ref Range   Color, Urine YELLOW YELLOW   APPearance CLEAR CLEAR   Specific Gravity, Urine 1.008  1.005 - 1.030   pH 8.0 5.0 - 8.0   Glucose, UA NEGATIVE NEGATIVE mg/dL   Hgb urine dipstick NEGATIVE NEGATIVE   Bilirubin Urine NEGATIVE NEGATIVE   Ketones, ur NEGATIVE NEGATIVE mg/dL   Protein, ur NEGATIVE NEGATIVE mg/dL   Nitrite NEGATIVE NEGATIVE   Leukocytes, UA TRACE (A) NEGATIVE   RBC / HPF 0-5 0 - 5 RBC/hpf   WBC, UA 0-5 0 - 5 WBC/hpf   Bacteria, UA RARE (A) NONE SEEN   Squamous Epithelial / LPF 0-5 (A) NONE SEEN   Mucus PRESENT   Wet prep, genital     Status: Abnormal   Collection Time: 03/05/17  9:11 AM  Result Value Ref Range   Yeast Wet Prep HPF POC NONE SEEN NONE SEEN   Trich, Wet Prep NONE SEEN NONE SEEN   Clue Cells Wet Prep HPF POC NONE SEEN NONE SEEN   WBC, Wet Prep HPF POC MANY (A) NONE SEEN   Sperm NONE SEEN     MAU Course  Procedures  MDM Patient has had tylenol for pain 0948: DW Dr. Henderson Cloudomblin, ok for DC home.   Assessment and Plan   1. [redacted] weeks gestation of pregnancy   2. Pain of round ligament affecting pregnancy, antepartum    DC home Comfort measures reviewed  3rd Trimester precautions  PTL precautions  Fetal kick counts RX: none  Return to MAU as needed FU with OB as planned  Follow-up Information    Harold Hedgeomblin, James, MD Follow up.   Specialty:  Obstetrics and Gynecology Contact information: [address] [place] [address] [phone]            Linda ShellerHeather Tonette Dominguez 03/05/2017, 8:30 AM

## 2017-03-06 LAB — GC/CHLAMYDIA PROBE AMP (~~LOC~~) NOT AT ARMC
CHLAMYDIA, DNA PROBE: NEGATIVE
Neisseria Gonorrhea: NEGATIVE

## 2017-04-30 NOTE — L&D Delivery Note (Signed)
Delivery Note At 1:30 AM a viable female was delivered via Vaginal, Spontaneous (Presentation: LOA).  APGARs: 8, 9; weight pending.   Placenta status: S, I. 3V Cord with the following complications: none.  Cord pH: n/a  Anesthesia:  CLEA Episiotomy: None Lacerations: 2nd degree Suture Repair: 3.0 vicryl rapide Est. Blood Loss (mL): 400  Mom to postpartum.  Baby to Couplet care / Skin to Skin.  Christyan Reger 06/05/2017, 1:51 AM

## 2017-05-23 ENCOUNTER — Encounter (HOSPITAL_COMMUNITY): Payer: Self-pay | Admitting: *Deleted

## 2017-05-23 ENCOUNTER — Inpatient Hospital Stay (HOSPITAL_COMMUNITY)
Admission: AD | Admit: 2017-05-23 | Discharge: 2017-05-23 | Disposition: A | Discharge status: Home / Self Care | Source: Ambulatory Visit | Attending: Obstetrics and Gynecology | Admitting: Obstetrics and Gynecology

## 2017-05-23 DIAGNOSIS — O2313 Infections of bladder in pregnancy, third trimester: Secondary | ICD-10-CM | POA: Insufficient documentation

## 2017-05-23 DIAGNOSIS — O2303 Infections of kidney in pregnancy, third trimester: Secondary | ICD-10-CM | POA: Diagnosis not present

## 2017-05-23 DIAGNOSIS — E039 Hypothyroidism, unspecified: Secondary | ICD-10-CM | POA: Diagnosis not present

## 2017-05-23 DIAGNOSIS — N3001 Acute cystitis with hematuria: Secondary | ICD-10-CM

## 2017-05-23 DIAGNOSIS — R509 Fever, unspecified: Secondary | ICD-10-CM | POA: Diagnosis not present

## 2017-05-23 DIAGNOSIS — O99283 Endocrine, nutritional and metabolic diseases complicating pregnancy, third trimester: Secondary | ICD-10-CM | POA: Insufficient documentation

## 2017-05-23 DIAGNOSIS — O2343 Unspecified infection of urinary tract in pregnancy, third trimester: Secondary | ICD-10-CM | POA: Diagnosis not present

## 2017-05-23 DIAGNOSIS — Z3A36 36 weeks gestation of pregnancy: Secondary | ICD-10-CM | POA: Diagnosis not present

## 2017-05-23 DIAGNOSIS — N12 Tubulo-interstitial nephritis, not specified as acute or chronic: Secondary | ICD-10-CM

## 2017-05-23 LAB — URINALYSIS, ROUTINE W REFLEX MICROSCOPIC
Bilirubin Urine: NEGATIVE
Glucose, UA: NEGATIVE mg/dL
Hgb urine dipstick: NEGATIVE
KETONES UR: 20 mg/dL — AB
Nitrite: NEGATIVE
Protein, ur: NEGATIVE mg/dL
SPECIFIC GRAVITY, URINE: 1.003 — AB (ref 1.005–1.030)
pH: 7 (ref 5.0–8.0)

## 2017-05-23 LAB — COMPREHENSIVE METABOLIC PANEL
ALBUMIN: 2.6 g/dL — AB (ref 3.5–5.0)
ALT: 21 U/L (ref 14–54)
AST: 22 U/L (ref 15–41)
Alkaline Phosphatase: 170 U/L — ABNORMAL HIGH (ref 38–126)
Anion gap: 11 (ref 5–15)
CHLORIDE: 102 mmol/L (ref 101–111)
CO2: 19 mmol/L — AB (ref 22–32)
Calcium: 8.2 mg/dL — ABNORMAL LOW (ref 8.9–10.3)
Creatinine, Ser: 0.76 mg/dL (ref 0.44–1.00)
GFR calc Af Amer: >60 mL/min (ref >60)
Glucose, Bld: 77 mg/dL (ref 65–99)
POTASSIUM: 3.1 mmol/L — AB (ref 3.5–5.1)
SODIUM: 132 mmol/L — AB (ref 135–145)
Total Bilirubin: 0.8 mg/dL (ref 0.3–1.2)
Total Protein: 6.6 g/dL (ref 6.5–8.1)

## 2017-05-23 LAB — APTT: APTT: 29 s (ref 24–36)

## 2017-05-23 LAB — CBC WITH DIFFERENTIAL/PLATELET
BASOS ABS: 0 10*3/uL (ref 0.0–0.1)
BASOS PCT: 0 %
EOS ABS: 0 10*3/uL (ref 0.0–0.7)
Eosinophils Relative: 0 %
HEMATOCRIT: 31.8 % — AB (ref 36.0–46.0)
Hemoglobin: 10.5 g/dL — ABNORMAL LOW (ref 12.0–15.0)
Lymphocytes Relative: 8 %
Lymphs Abs: 1.1 10*3/uL (ref 0.7–4.0)
MCH: 28 pg (ref 26.0–34.0)
MCHC: 33 g/dL (ref 30.0–36.0)
MCV: 84.8 fL (ref 78.0–100.0)
MONO ABS: 0.9 10*3/uL (ref 0.1–1.0)
Monocytes Relative: 6 %
NEUTROS ABS: 12.7 10*3/uL — AB (ref 1.7–7.7)
Neutrophils Relative %: 86 %
PLATELETS: 264 10*3/uL (ref 150–400)
RBC: 3.75 MIL/uL — ABNORMAL LOW (ref 3.87–5.11)
RDW: 13.5 % (ref 11.5–15.5)
WBC: 14.7 10*3/uL — ABNORMAL HIGH (ref 4.0–10.5)

## 2017-05-23 LAB — INFLUENZA PANEL BY PCR (TYPE A & B)
INFLBPCR: NEGATIVE
Influenza A By PCR: NEGATIVE

## 2017-05-23 LAB — TYPE AND SCREEN
ABO/RH(D): O POS
ANTIBODY SCREEN: NEGATIVE

## 2017-05-23 LAB — LACTIC ACID, PLASMA: LACTIC ACID, VENOUS: 0.9 mmol/L (ref 0.5–1.9)

## 2017-05-23 LAB — PROTIME-INR
INR: 0.97
PROTHROMBIN TIME: 12.8 s (ref 11.4–15.2)

## 2017-05-23 LAB — PROCALCITONIN: PROCALCITONIN: 0.12 ng/mL

## 2017-05-23 MED ORDER — CEPHALEXIN 500 MG PO CAPS: 500.0000 mg | ORAL_CAPSULE | Freq: Four times a day (QID) | ORAL | 2 refills | Status: DC

## 2017-05-23 MED ORDER — ACETAMINOPHEN 325 MG PO TABS
650.0000 mg | ORAL_TABLET | Freq: Once | ORAL | Status: AC
  Administered 2017-05-23: 650 mg via ORAL
  Filled 2017-05-23: qty 2

## 2017-05-23 MED ORDER — SODIUM CHLORIDE 0.9 % IV SOLN
INTRAVENOUS | Status: DC
  Administered 2017-05-23: 22:00:00 via INTRAVENOUS

## 2017-05-23 MED ORDER — DEXTROSE 5 % IV SOLN
2.0000 g | Freq: Once | INTRAVENOUS | Status: AC
  Administered 2017-05-23: 2 g via INTRAVENOUS
  Filled 2017-05-23: qty 2

## 2017-05-23 NOTE — MAU Note (Signed)
Pt presents to MAU c/o abdominal ctxs that have been occurring all day however in the last couple of hours they have become about 5 min apart. Pt states the pain has then moved into her back. Denies bleeding or LOF. Reports good FM.

## 2017-05-23 NOTE — Discharge Instructions (Signed)
Pyelonephritis, Adult Pyelonephritis is a kidney infection. The kidneys are the organs that filter a person's blood and move waste out of the bloodstream and into the urine. Urine passes from the kidneys, through the ureters, and into the bladder. There are two main types of pyelonephritis:  Infections that come on quickly without any warning (acute pyelonephritis).  Infections that last for a long period of time (chronic pyelonephritis).  In most cases, the infection clears up with treatment and does not cause further problems. More severe infections or chronic infections can sometimes spread to the bloodstream or lead to other problems with the kidneys. What are the causes? This condition is usually caused by:  Bacteria traveling from the bladder to the kidney through infected urine. The urine in the bladder can become infected with bacteria from: ? Bladder infection (cystitis). ? Inflammation of the prostate gland (prostatitis). ? Sexual intercourse, in females.  Bacteria traveling from the bloodstream to the kidney.  What increases the risk? This condition is more likely to develop in:  Pregnant women.  Older people.  People who have diabetes.  People who have kidney stones or bladder stones.  People who have other abnormalities of the kidney or ureter.  People who have a catheter placed in the bladder.  People who have cancer.  People who are sexually active.  Women who use spermicides.  People who have had a prior urinary tract infection.  What are the signs or symptoms? Symptoms of this condition include:  Frequent urination.  Strong or persistent urge to urinate.  Burning or stinging when urinating.  Abdominal pain.  Back pain.  Pain in the side or flank area.  Fever.  Chills.  Blood in the urine, or dark urine.  Nausea.  Vomiting.  How is this diagnosed? This condition may be diagnosed based on:  Medical history and physical exam.  Urine  tests.  Blood tests.  You may also have imaging tests of the kidneys, such as an ultrasound or CT scan. How is this treated? Treatment for this condition may depend on the severity of the infection.  If the infection is mild and is found early, you may be treated with antibiotic medicines taken by mouth. You will need to drink fluids to remain hydrated.  If the infection is more severe, you may need to stay in the hospital and receive antibiotics given directly into a vein through an IV tube. You may also need to receive fluids through an IV tube if you are not able to remain hydrated. After your hospital stay, you may need to take oral antibiotics for a period of time.  Other treatments may be required, depending on the cause of the infection. Follow these instructions at home: Medicines  Take over-the-counter and prescription medicines only as told by your health care provider.  If you were prescribed an antibiotic medicine, take it as told by your health care provider. Do not stop taking the antibiotic even if you start to feel better. General instructions  Drink enough fluid to keep your urine clear or pale yellow.  Avoid caffeine, tea, and carbonated beverages. They tend to irritate the bladder.  Urinate often. Avoid holding in urine for long periods of time.  Urinate before and after sex.  After a bowel movement, women should cleanse from front to back. Use each tissue only once.  Keep all follow-up visits as told by your health care provider. This is important. Contact a health care provider if:  Your symptoms   do not get better after 2 days of treatment.  Your symptoms get worse.  You have a fever. Get help right away if:  You are unable to take your antibiotics or fluids.  You have shaking chills.  You vomit.  You have severe flank or back pain.  You have extreme weakness or fainting. This information is not intended to replace advice given to you by your  health care provider. Make sure you discuss any questions you have with your health care provider. Document Released: 04/16/2005 Document Revised: 09/22/2015 Document Reviewed: 08/09/2014 Elsevier Interactive Patient Education  2018 [employer]  

## 2017-05-23 NOTE — MAU Provider Note (Signed)
Chief Complaint:  Contractions   First Provider Initiated Contact with Patient 05/23/17 2048     HPI: Linda Dominguez is a 27 y.o. G1P0 at 4636w5dwho presents to maternity admissions reporting uterine contractions every 5 minutes. Also has back pain with these contractions. Pain is in right flank and low back.  She reports good fetal movement, denies LOF, vaginal bleeding, vaginal itching/burning, urinary symptoms, h/a, dizziness, n/v, diarrhea, constipation or fever/chills.   Abdominal Pain  This is a new problem. The current episode started today. The onset quality is gradual. The problem occurs intermittently. The problem has been unchanged. The pain is located in the LLQ, RLQ and suprapubic region. The quality of the pain is cramping. The abdominal pain radiates to the back. Pertinent negatives include no constipation, diarrhea, fever, myalgias, nausea or vomiting. Nothing aggravates the pain. The pain is relieved by nothing. She has tried nothing for the symptoms.    RN Note: Pt presents to MAU c/o abdominal ctxs that have been occurring all day however in the last couple of hours they have become about 5 min apart. Pt states the pain has then moved into her back. Denies bleeding or LOF. Reports good FM    Past Medical History: Past Medical History:  Diagnosis Date  . Concussion    She's had 3, last one was when she was 20  . Hypothyroidism 05/01/2015    Past obstetric history: OB History  Gravida Para Term Preterm AB Living  1            SAB TAB Ectopic Multiple Live Births               # Outcome Date GA Lbr Len/2nd Weight Sex Delivery Anes PTL Lv  1 Current               Past Surgical History: Past Surgical History:  Procedure Laterality Date  . COLONOSCOPY WITH PROPOFOL N/A 05/06/2015   Procedure: COLONOSCOPY WITH PROPOFOL;  Surgeon: Wallace CullensPaul Y Oh, MD;  [facility];  Service: Gastroenterology;  Laterality: N/A;  . TONSILLECTOMY AND ADENOIDECTOMY  1995     Family History: Family History  Problem Relation Age of Onset  . Depression Father   . Mental illness Father        Attempted Suicide  . Alcohol abuse Maternal Grandmother   . Hyperlipidemia Maternal Grandmother   . Heart disease Maternal Grandmother   . Diabetes Maternal Grandmother   . Heart disease Maternal Grandfather   . Stroke Maternal Grandfather   . Diabetes Maternal Grandfather   . Early death Paternal Grandmother     Social History: Social History   Tobacco Use  . Smoking status: Never Smoker  . Smokeless tobacco: Never Used  Substance Use Topics  . Alcohol use: No    Alcohol/week: 0.0 oz    Frequency: Never    Comment: not currently  . Drug use: No    Allergies: No Known Allergies  Meds:  Medications Prior to Admission  Medication Sig Dispense Refill Last Dose  . Prenatal Vit-Fe Fumarate-FA (PRENATAL MULTIVITAMIN) TABS tablet Take 1 tablet by mouth daily at 12 noon.   05/23/2017 at Unknown time  . levothyroxine (SYNTHROID, LEVOTHROID) 75 MCG tablet Take 1 tablet (75 mcg total) by mouth daily. 30 tablet 1 More than a month at Unknown time    I have reviewed patient's Past Medical Hx, Surgical Hx, Family Hx, Social Hx, medications and allergies.   ROS:  Review of Systems  Constitutional:  Negative for fever.  Gastrointestinal: Positive for abdominal pain. Negative for constipation, diarrhea, nausea and vomiting.  Musculoskeletal: Negative for myalgias.   Other systems negative  Physical Exam   Patient Vitals for the past 24 hrs:  BP Temp Temp src Pulse Resp SpO2 Height Weight  05/23/17 2136 - (!) 101.2 F (38.4 C) Oral - - - - -  05/23/17 2123 - - - - - 98 % - -  05/23/17 2118 - - - - - 97 % - -  05/23/17 2113 - - - - - 97 % - -  05/23/17 2108 - - - - - 98 % - -  05/23/17 2107 - - - - - 98 % - -  05/23/17 2102 - - - - - 100 % - -  05/23/17 2015 - (!) 100.8 F (38.2 C) Axillary - - - - -  05/23/17 1942 120/77 98.8 F (37.1 C) Oral (!) 130 18  97 % 5\' 6"  (1.676 m) 201 lb 1.9 oz (91.2 kg)   Constitutional: Well-developed, well-nourished female in no acute distress.  Cardiovascular: normal rate and rhythm Respiratory: normal effort, clear to auscultation bilaterally GI: Abd soft, non-tender, gravid appropriate for gestational age.   No rebound or guarding. MS: Extremities nontender, no edema, normal ROM Neurologic: Alert and oriented x 4.  GU: Neg CVAT.  PELVIC EXAM:  Dilation: Fingertip Effacement (%): 40 Station: -3, -2 Exam by:: lauren fields rn   FHT:  Baseline 135-140 , moderate variability, accelerations present, no decelerations Contractions:  Irregular     Labs: Results for orders placed or performed during the hospital encounter of 05/23/17 (from the past 24 hour(s))  CBC with Differential     Status: Abnormal   Collection Time: 05/23/17  8:24 PM  Result Value Ref Range   WBC 14.7 (H) 4.0 - 10.5 K/uL   RBC 3.75 (L) 3.87 - 5.11 MIL/uL   Hemoglobin 10.5 (L) 12.0 - 15.0 g/dL   HCT 16.1 (L) 09.6 - 04.5 %   MCV 84.8 78.0 - 100.0 fL   MCH 28.0 26.0 - 34.0 pg   MCHC 33.0 30.0 - 36.0 g/dL   RDW 40.9 81.1 - 91.4 %   Platelets 264 150 - 400 K/uL   Neutrophils Relative % 86 %   Neutro Abs 12.7 (H) 1.7 - 7.7 K/uL   Lymphocytes Relative 8 %   Lymphs Abs 1.1 0.7 - 4.0 K/uL   Monocytes Relative 6 %   Monocytes Absolute 0.9 0.1 - 1.0 K/uL   Eosinophils Relative 0 %   Eosinophils Absolute 0.0 0.0 - 0.7 K/uL   Basophils Relative 0 %   Basophils Absolute 0.0 0.0 - 0.1 K/uL  Urinalysis, Routine w reflex microscopic     Status: Abnormal   Collection Time: 05/23/17  8:40 PM  Result Value Ref Range   Color, Urine YELLOW YELLOW   APPearance CLEAR CLEAR   Specific Gravity, Urine 1.003 (L) 1.005 - 1.030   pH 7.0 5.0 - 8.0   Glucose, UA NEGATIVE NEGATIVE mg/dL   Hgb urine dipstick NEGATIVE NEGATIVE   Bilirubin Urine NEGATIVE NEGATIVE   Ketones, ur 20 (A) NEGATIVE mg/dL   Protein, ur NEGATIVE NEGATIVE mg/dL   Nitrite  NEGATIVE NEGATIVE   Leukocytes, UA SMALL (A) NEGATIVE   RBC / HPF 0-5 0 - 5 RBC/hpf   WBC, UA 6-30 0 - 5 WBC/hpf   Bacteria, UA MANY (A) NONE SEEN   Squamous Epithelial / LPF 0-5 (A) NONE SEEN  Influenza panel by PCR (type A & B)     Status: None   Collection Time: 05/23/17  8:50 PM  Result Value Ref Range   Influenza A By PCR NEGATIVE NEGATIVE   Influenza B By PCR NEGATIVE NEGATIVE      Imaging:  No results found.  MAU Course/MDM: I have ordered labs and reviewed results. Flu test negative.  Urine somewhat suspicious for UTI/pyelo, along with elevated WBC.  Lactic acid negative, so sepsis ruled out. Given CVAT and low back pain will treat with one dose of Rocephin and then discharge home with Keflex.  NST reviewed and found to be reactive Consult Dr Rana Snare with presentation, exam findings and test results.  Treatments in MAU included IV hydration and Rocephin.    Assessment: Single IUP at [redacted]w[redacted]d Acute cystitis with hematuria - Plan: Discharge patient  Fever and chills - Plan: Discharge patient  Pyelonephritis - Plan: Discharge patient   Plan: Discharge home Labor precautions and fetal kick counts Rx Keflex 500mg  qid x 7d Follow up in Office for prenatal visits and recheck of status Strict return precautions  Encouraged to return here or to other Urgent Care/ED if she develops worsening of symptoms, increase in pain, fever, or other concerning symptoms.   Pt stable at time of discharge.  Wynelle Bourgeois CNM, MSN Certified Nurse-Midwife 05/23/2017 9:50 PM

## 2017-05-24 LAB — ABO/RH: ABO/RH(D): O POS

## 2017-05-25 ENCOUNTER — Inpatient Hospital Stay (HOSPITAL_COMMUNITY)

## 2017-05-25 ENCOUNTER — Encounter (HOSPITAL_COMMUNITY): Payer: Self-pay

## 2017-05-25 ENCOUNTER — Inpatient Hospital Stay (HOSPITAL_COMMUNITY)
Admission: AD | Admit: 2017-05-25 | Discharge: 2017-05-25 | Disposition: A | Discharge status: Home / Self Care | Source: Ambulatory Visit | Attending: Obstetrics & Gynecology | Admitting: Obstetrics & Gynecology

## 2017-05-25 DIAGNOSIS — W1830XA Fall on same level, unspecified, initial encounter: Secondary | ICD-10-CM | POA: Diagnosis not present

## 2017-05-25 DIAGNOSIS — O9A213 Injury, poisoning and certain other consequences of external causes complicating pregnancy, third trimester: Secondary | ICD-10-CM | POA: Diagnosis present

## 2017-05-25 DIAGNOSIS — Z3A38 38 weeks gestation of pregnancy: Secondary | ICD-10-CM | POA: Insufficient documentation

## 2017-05-25 DIAGNOSIS — E039 Hypothyroidism, unspecified: Secondary | ICD-10-CM | POA: Insufficient documentation

## 2017-05-25 DIAGNOSIS — O99283 Endocrine, nutritional and metabolic diseases complicating pregnancy, third trimester: Secondary | ICD-10-CM | POA: Diagnosis not present

## 2017-05-25 DIAGNOSIS — R109 Unspecified abdominal pain: Secondary | ICD-10-CM

## 2017-05-25 DIAGNOSIS — O26893 Other specified pregnancy related conditions, third trimester: Secondary | ICD-10-CM | POA: Diagnosis not present

## 2017-05-25 LAB — URINALYSIS, ROUTINE W REFLEX MICROSCOPIC
BILIRUBIN URINE: NEGATIVE
GLUCOSE, UA: NEGATIVE mg/dL
Ketones, ur: 5 mg/dL — AB
NITRITE: NEGATIVE
PROTEIN: NEGATIVE mg/dL
Specific Gravity, Urine: 1.003 — ABNORMAL LOW (ref 1.005–1.030)
pH: 7 (ref 5.0–8.0)

## 2017-05-25 LAB — COMPREHENSIVE METABOLIC PANEL
ALT: 22 U/L (ref 14–54)
ANION GAP: 8 (ref 5–15)
AST: 21 U/L (ref 15–41)
Albumin: 2.5 g/dL — ABNORMAL LOW (ref 3.5–5.0)
Alkaline Phosphatase: 169 U/L — ABNORMAL HIGH (ref 38–126)
BUN: 5 mg/dL — ABNORMAL LOW (ref 6–20)
CO2: 21 mmol/L — AB (ref 22–32)
CREATININE: 0.75 mg/dL (ref 0.44–1.00)
Calcium: 8.2 mg/dL — ABNORMAL LOW (ref 8.9–10.3)
Chloride: 104 mmol/L (ref 101–111)
Glucose, Bld: 87 mg/dL (ref 65–99)
Potassium: 3.3 mmol/L — ABNORMAL LOW (ref 3.5–5.1)
SODIUM: 133 mmol/L — AB (ref 135–145)
Total Bilirubin: 0.7 mg/dL (ref 0.3–1.2)
Total Protein: 6.6 g/dL (ref 6.5–8.1)

## 2017-05-25 LAB — CBC WITH DIFFERENTIAL/PLATELET
Basophils Absolute: 0 10*3/uL (ref 0.0–0.1)
Basophils Relative: 0 %
EOS ABS: 0.1 10*3/uL (ref 0.0–0.7)
Eosinophils Relative: 1 %
HEMATOCRIT: 27.9 % — AB (ref 36.0–46.0)
HEMOGLOBIN: 9.4 g/dL — AB (ref 12.0–15.0)
Lymphocytes Relative: 14 %
Lymphs Abs: 1.9 10*3/uL (ref 0.7–4.0)
MCH: 28.3 pg (ref 26.0–34.0)
MCHC: 33.7 g/dL (ref 30.0–36.0)
MCV: 84 fL (ref 78.0–100.0)
MONOS PCT: 5 %
Monocytes Absolute: 0.7 10*3/uL (ref 0.1–1.0)
NEUTROS ABS: 11.2 10*3/uL — AB (ref 1.7–7.7)
NEUTROS PCT: 80 %
Platelets: 240 10*3/uL (ref 150–400)
RBC: 3.32 MIL/uL — AB (ref 3.87–5.11)
RDW: 13.7 % (ref 11.5–15.5)
WBC: 13.9 10*3/uL — AB (ref 4.0–10.5)

## 2017-05-25 LAB — CULTURE, OB URINE: Culture: >=100000 — AB

## 2017-05-25 IMAGING — US US MFM OB LIMITED
1 series · 15 of 24 positions shown · non-contrast
Comparison: none

[Series 1: us mfm ob limited · 15 of 24 slices shown]
[im 1/24]
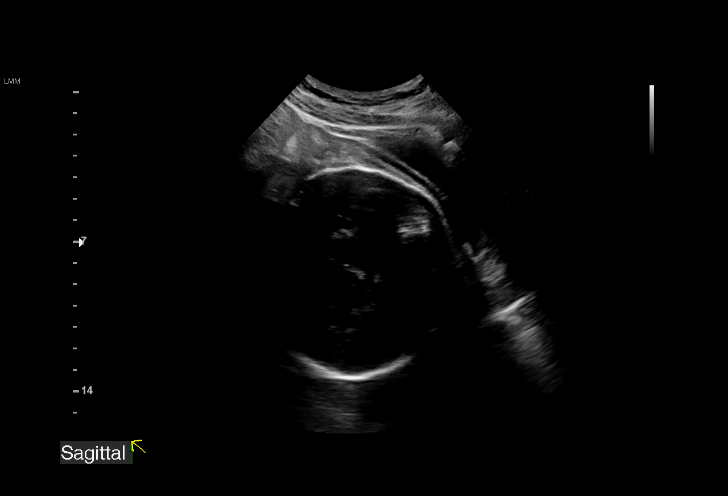
[im 3/24]
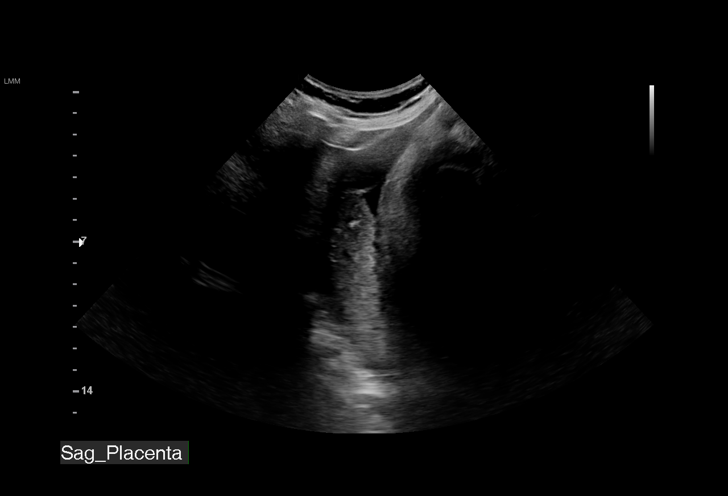
[im 5/24]
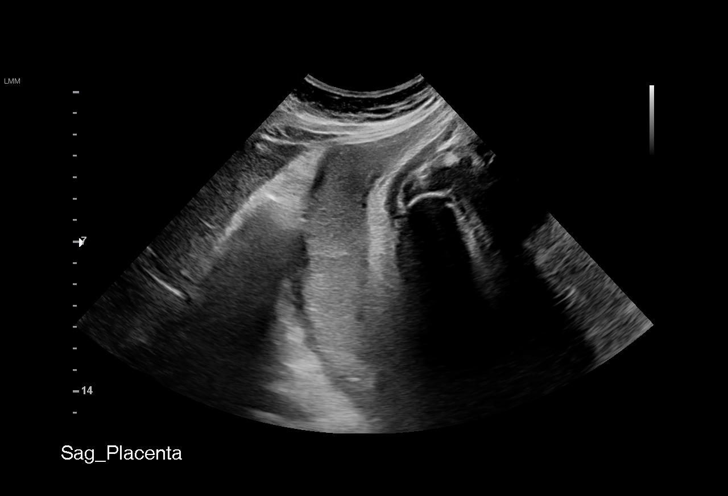
[im 6/24]
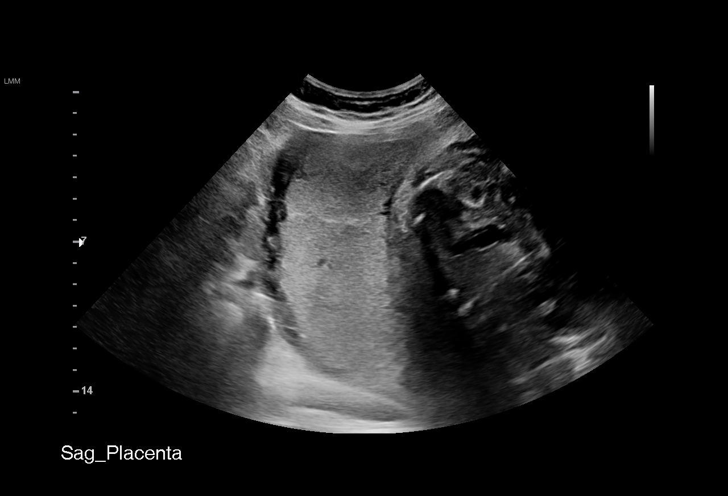
[im 8/24]
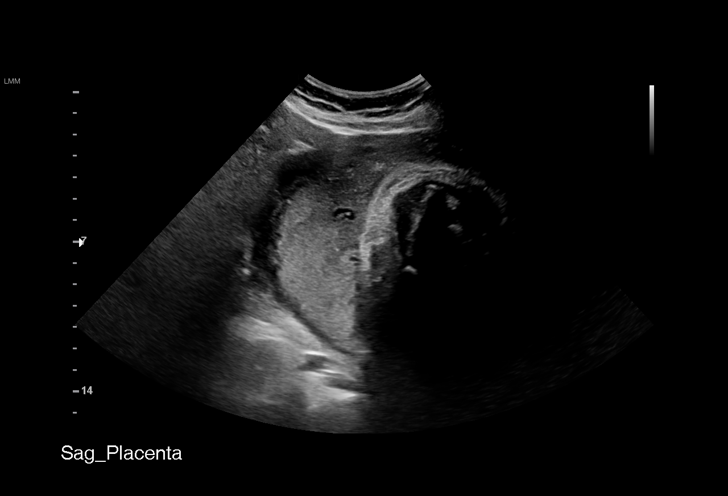
[im 9/24]
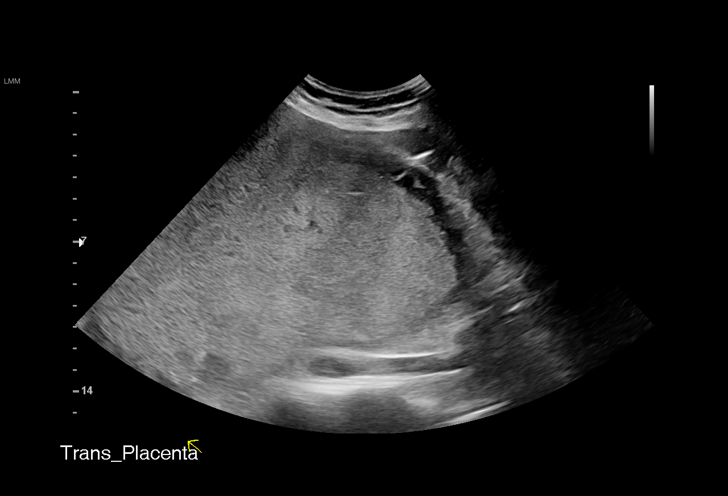
[im 11/24]
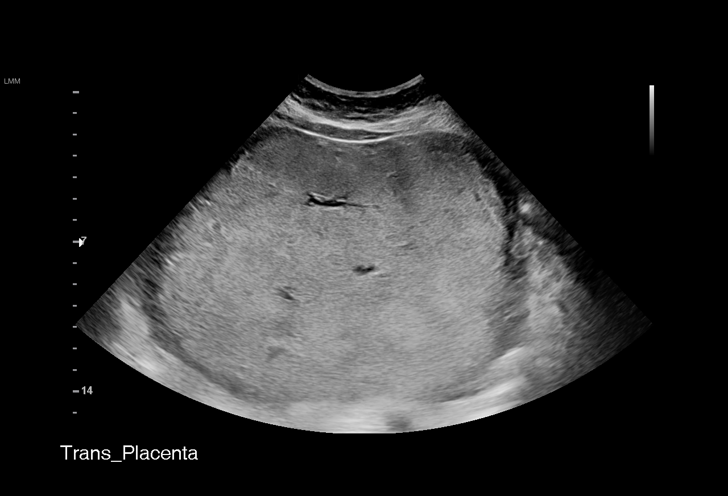
[im 13/24]
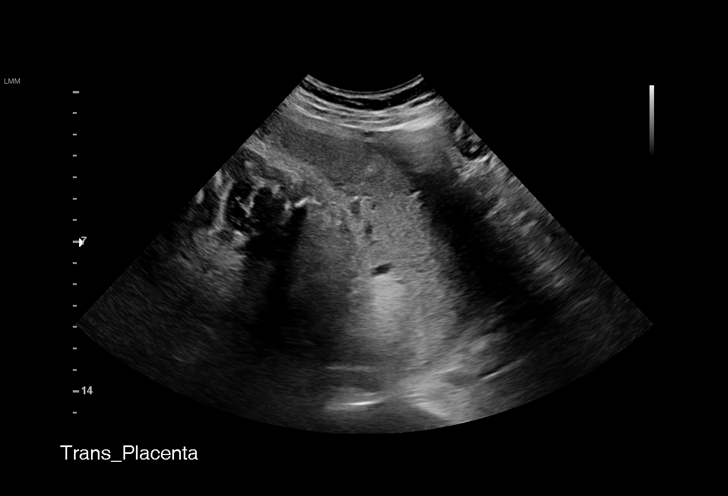
[im 14/24]
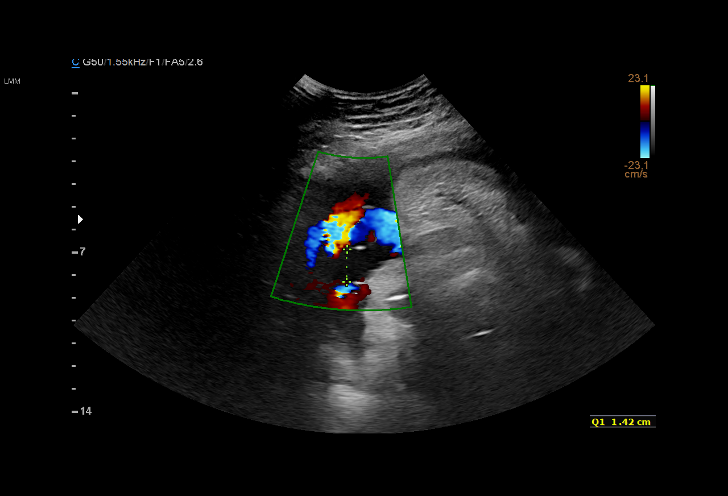
[im 16/24]
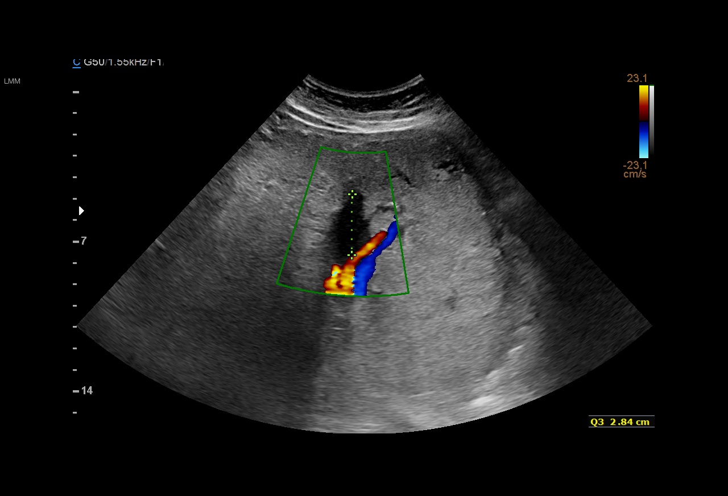
[im 17/24]
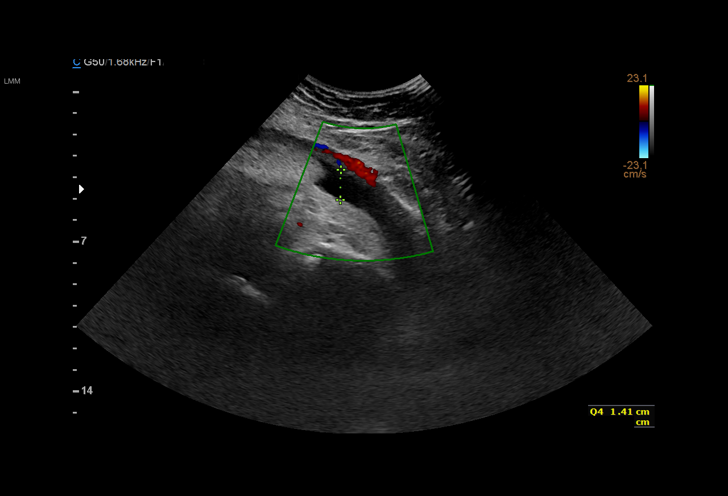
[im 19/24]
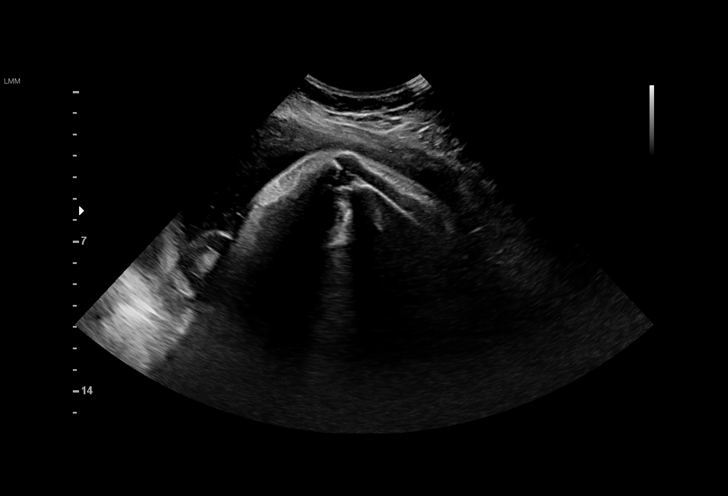
[im 21/24]
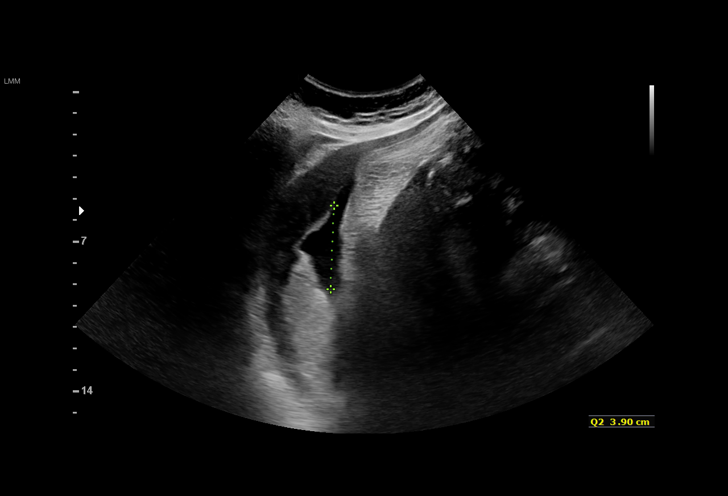
[im 22/24]
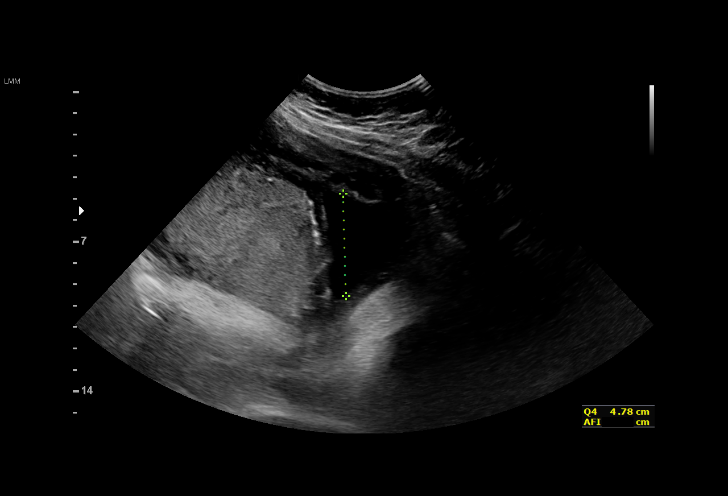
[im 24/24]
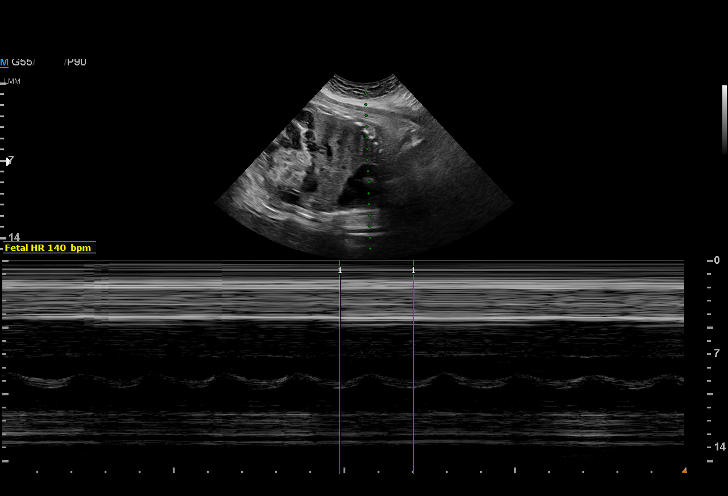

[15 of 24 positions shown; findings below may reference images not displayed]

MAU/Triage
DO
[REDACTED]. [REDACTED]

1  ALFA           [PHONE_NUMBER]      [PHONE_NUMBER]     [PHONE_NUMBER]
Indications

38 weeks gestation of pregnancy
Traumatic injury during pregnancy              O9A.219 [P2]
OB History

Gravidity:    1
Fetal Evaluation

Num Of Fetuses:     1
Fetal Heart         140
Rate(bpm):
Cardiac Activity:   Observed
Presentation:       Cephalic
Placenta:           Fundal, above cervical os

Amniotic Fluid
AFI FV:      Subjectively low-normal

AFI Sum(cm)     %Tile       Largest Pocket(cm)
10.21           28

RUQ(cm)       RLQ(cm)       LUQ(cm)        LLQ(cm)
2.04

Comment:    No definite evidence of placental abruption.  Technically difficult
to evaluate due to position.
Gestational Age

LMP:           37w 0d        Date:  [DATE]                 EDD:    [DATE]
Clinical EDD:  38w 3d                                        EDD:    [DATE]
Best:          38w 3d     Det. By:  Clinical EDD             EDD:    [DATE]
Impression

SIUP at 38+3 weeks
Cephalic presentation
Normal amniotic fluid volume
Fundal placenta; no previa; no subchorionic fluid
collections/hemorrhage identified
Recommendations

Follow-up as clinically indicated

## 2017-05-25 MED ORDER — LACTATED RINGERS IV BOLUS (SEPSIS)
1000.0000 mL | Freq: Once | INTRAVENOUS | Status: AC
  Administered 2017-05-25: 1000 mL via INTRAVENOUS

## 2017-05-25 MED ORDER — LIDOCAINE HCL 1 % IJ SOLN
INTRAMUSCULAR | Status: AC
  Filled 2017-05-25: qty 20

## 2017-05-25 NOTE — MAU Provider Note (Signed)
History    CSN: [id number]  Arrival date and time: 05/25/17 [id number]   First Provider Initiated Contact with Patient 05/25/17 [id number]     Chief Complaint  Patient presents with  . Fall  . Abdominal Pain  . Back Pain  . Dizziness   HPI Linda Dominguez is a 27 y.o. G1P0 at 3270w3d who presents after at fall at 0630 this morning. She states she was feeling acid reflux and got up to get medication and got dizzy. She states she fell and was on her side. Unsure if she had direct impact to her abdomen.   She was seen on 1/24 and diagnosed with pyelonephritis, given IV Rocephin and Keflex at home. She states since being discharged, the burning in her right flank has gradually gotten worse. She states the pain is constant now and rates it a 10/10. She tried tylenol for the pain with minimal relief.   She denies any vaginal bleeding or leaking of fluid. Reports good fetal movement. She also reports intermittent contractions that have been ongoing over the last week.   OB History    Gravida Para Term Preterm AB Living   1             SAB TAB Ectopic Multiple Live Births                  Past Medical History:  Diagnosis Date  . Concussion    She's had 3, last one was when she was 20  . Hypothyroidism 05/01/2015    Past Surgical History:  Procedure Laterality Date  . COLONOSCOPY WITH PROPOFOL N/A 05/06/2015   Procedure: COLONOSCOPY WITH PROPOFOL;  Surgeon: Wallace CullensPaul Y Oh, MD;  [facility];  Service: Gastroenterology;  Laterality: N/A;  . TONSILLECTOMY AND ADENOIDECTOMY  1995    Family History  Problem Relation Age of Onset  . Depression Father   . Mental illness Father        Attempted Suicide  . Alcohol abuse Maternal Grandmother   . Hyperlipidemia Maternal Grandmother   . Heart disease Maternal Grandmother   . Diabetes Maternal Grandmother   . Heart disease Maternal Grandfather   . Stroke Maternal Grandfather   . Diabetes Maternal Grandfather   . Early death Paternal  Grandmother     Social History   Tobacco Use  . Smoking status: Never Smoker  . Smokeless tobacco: Never Used  Substance Use Topics  . Alcohol use: No    Alcohol/week: 0.0 oz    Frequency: Never    Comment: not currently  . Drug use: No    Allergies: No Known Allergies  Medications Prior to Admission  Medication Sig Dispense Refill Last Dose  . cephALEXin (KEFLEX) 500 MG capsule Take 1 capsule (500 mg total) by mouth 4 (four) times daily. 28 capsule 2   . levothyroxine (SYNTHROID, LEVOTHROID) 75 MCG tablet Take 1 tablet (75 mcg total) by mouth daily. 30 tablet 1 More than a month at Unknown time  . Prenatal Vit-Fe Fumarate-FA (PRENATAL MULTIVITAMIN) TABS tablet Take 1 tablet by mouth daily at 12 noon.   05/23/2017 at Unknown time    Review of Systems  Constitutional: Negative.  Negative for fatigue and fever.  HENT: Negative.   Respiratory: Negative.  Negative for shortness of breath.   Cardiovascular: Negative.  Negative for chest pain.  Gastrointestinal: Positive for nausea. Negative for abdominal pain, constipation, diarrhea and vomiting.  Genitourinary: Positive for flank pain. Negative for dysuria, vaginal bleeding and vaginal discharge.  Musculoskeletal: Positive for back pain.  Neurological: Negative.  Negative for dizziness and headaches.   Physical Exam   Blood pressure 116/79, pulse (!) 114, temperature 99.5 F (37.5 C), temperature source Oral, resp. rate 18, height 5\' 6"  (1.676 m), weight 201 lb (91.2 kg), last menstrual period 09/08/2016, SpO2 98 %.  Patient Vitals for the past 24 hrs:  BP Temp Temp src Pulse Resp SpO2 Height Weight  05/25/17 0745 - - - (!) 114 - 98 % - -  05/25/17 0732 116/79 99.5 F (37.5 C) Oral (!) 123 18 - - -  05/25/17 9604 - - - - - - 5\' 6"  (1.676 m) 201 lb (91.2 kg)    Physical Exam  Nursing note and vitals reviewed. Constitutional: She is oriented to person, place, and time. She appears well-developed and well-nourished. No  distress.  HENT:  Head: Normocephalic.  Eyes: Pupils are equal, round, and reactive to light.  Cardiovascular: Normal rate, regular rhythm and normal heart sounds.  Respiratory: Effort normal and breath sounds normal. No respiratory distress.  GI: Soft. Bowel sounds are normal. She exhibits no distension. There is no tenderness. There is CVA tenderness (on right side).  Neurological: She is alert and oriented to person, place, and time.  Skin: Skin is warm and dry.  Psychiatric: She has a normal mood and affect. Her behavior is normal. Judgment and thought content normal.   Fetal Tracing:  Baseline: 150 Variability: moderate Accels: 15x15 Decels: none  Toco: 8-10  MAU Course  Procedures Results for orders placed or performed during the hospital encounter of 05/25/17 (from the past 24 hour(s))  Urinalysis, Routine w reflex microscopic     Status: Abnormal   Collection Time: 05/25/17  7:15 AM  Result Value Ref Range   Color, Urine YELLOW YELLOW   APPearance CLEAR CLEAR   Specific Gravity, Urine 1.003 (L) 1.005 - 1.030   pH 7.0 5.0 - 8.0   Glucose, UA NEGATIVE NEGATIVE mg/dL   Hgb urine dipstick MODERATE (A) NEGATIVE   Bilirubin Urine NEGATIVE NEGATIVE   Ketones, ur 5 (A) NEGATIVE mg/dL   Protein, ur NEGATIVE NEGATIVE mg/dL   Nitrite NEGATIVE NEGATIVE   Leukocytes, UA MODERATE (A) NEGATIVE   RBC / HPF 0-5 0 - 5 RBC/hpf   WBC, UA 6-30 0 - 5 WBC/hpf   Bacteria, UA FEW (A) NONE SEEN   Squamous Epithelial / LPF 6-30 (A) NONE SEEN   OB Limited MFM U/S: no evidence of placental abruption, normal AFI  MDM UA LR bolus CBC with Diff, CMP 4 hours FHT monitoring  Care turned over to R. Arita Miss CNM at [address], CNM 05/25/17 8:12 AM  *Consult with Dr. Langston Masker @ 1020 - notified of patient's complaints, assessments, lab & U/S results -- orders received to call MD back for update on patient status before d/c home, may consider admission for IV abx // TC to Dr. Langston Masker  @ 1124 to update on patient status: ok to d/c home  Reassessment @ 1105 -- patient asleep not aroused while CNM speaking to patient's mother about her current pain status. Per mother the patient has not complained of pain "in a long time. She's just exhausted now."  Assessment and Plan  Traumatic injury during pregnancy in third trimester  - Bleeding precautions given - Advised to call OB office and return to MAU for any bleeding, decreased or no fetal movement. - Discharge home - Keep scheduled appt next week. - Patient verbalized an  understanding of the plan of care and agrees.   Raelyn Mora, MSN, CNM 05/25/2017 8:19 AM

## 2017-05-25 NOTE — MAU Note (Signed)
Pt reports falling around 0630. When got out of bed pt reports feeling "spins", took 2 steps and fell. Husband reports he found pt laying on side and pt does not remember if she fell on her stomach or on back.  Here Thursday and was sent home with pyelo- had fever went she home and then also yesterday and them came down with medication.  Chills and acid reflux woke pt up- got up to get tums and that's when she fell.  Reports pain in lower back since Thursday. Within last day pt reports top of abdomen is sore and uncomfortable.

## 2017-05-25 NOTE — Discharge Instructions (Signed)
Continue current treatment for kidney infection. Bleeding precautions provided as information only. Please call your OB office, if you start having any bleeding or decreased/no fetal movement. Keep your scheduled OB appt next week.

## 2017-05-28 LAB — CULTURE, BLOOD (ROUTINE X 2)
Culture: NO GROWTH
Culture: NO GROWTH
Special Requests: ADEQUATE
Special Requests: ADEQUATE

## 2017-06-03 ENCOUNTER — Encounter (HOSPITAL_COMMUNITY): Payer: Self-pay | Admitting: *Deleted

## 2017-06-03 ENCOUNTER — Inpatient Hospital Stay (HOSPITAL_COMMUNITY)
Admission: AD | Admit: 2017-06-03 | Discharge: 2017-06-03 | Disposition: A | Discharge status: Home / Self Care | Source: Ambulatory Visit | Attending: Obstetrics and Gynecology | Admitting: Obstetrics and Gynecology

## 2017-06-03 ENCOUNTER — Inpatient Hospital Stay (EMERGENCY_DEPARTMENT_HOSPITAL)
Admission: AD | Admit: 2017-06-03 | Discharge: 2017-06-04 | Disposition: A | Discharge status: Home / Self Care | Source: Ambulatory Visit | Attending: Obstetrics and Gynecology | Admitting: Obstetrics and Gynecology

## 2017-06-03 DIAGNOSIS — O471 False labor at or after 37 completed weeks of gestation: Secondary | ICD-10-CM

## 2017-06-03 DIAGNOSIS — O479 False labor, unspecified: Secondary | ICD-10-CM

## 2017-06-03 DIAGNOSIS — Z3A39 39 weeks gestation of pregnancy: Secondary | ICD-10-CM

## 2017-06-03 DIAGNOSIS — O9A213 Injury, poisoning and certain other consequences of external causes complicating pregnancy, third trimester: Secondary | ICD-10-CM

## 2017-06-03 MED ORDER — MORPHINE SULFATE (PF) 4 MG/ML IV SOLN
6.0000 mg | Freq: Once | INTRAVENOUS | Status: AC
  Administered 2017-06-03: 6 mg via INTRAVENOUS
  Filled 2017-06-03: qty 2

## 2017-06-03 MED ORDER — PROMETHAZINE HCL 25 MG/ML IJ SOLN
12.5000 mg | Freq: Once | INTRAMUSCULAR | Status: AC
  Administered 2017-06-03: 12.5 mg via INTRAMUSCULAR
  Filled 2017-06-03: qty 1

## 2017-06-03 NOTE — MAU Note (Signed)
Pt C/O intense uc's since 0500, denies bleeding or LOF.

## 2017-06-03 NOTE — MAU Note (Signed)
Pt states she was seen earlier today and was 1cm. States contractions are now every 4 minutes. Denies LOF or vag bleeding. Reports good fetal movement.

## 2017-06-03 NOTE — MAU Note (Signed)
I have communicated with Dr. Rana SnareLowe and reviewed vital signs:  Vitals:   06/03/17 0852  BP: 125/85  Pulse: 73  Temp: 97.7 F (36.5 C)    Vaginal exam:  Dilation: 1 Effacement (%): 80 Cervical Position: Posterior Station: -3 Presentation: Vertex Exam by:: Dorrene GermanJ. Josep Luviano RN,   Also reviewed contraction pattern and that non-stress test is reactive.  It has been documented that patient is contracting every 2-7 minutes with no cervical change over 2 hours not indicating active labor.  Patient denies any other complaints.  Based on this report provider has given order for discharge.  A discharge order and diagnosis entered by a provider.   Labor discharge instructions reviewed with patient.

## 2017-06-03 NOTE — Discharge Instructions (Signed)
Braxton Hicks Contractions °Contractions of the uterus can occur throughout pregnancy, but they are not always a sign that you are in labor. You may have practice contractions called Braxton Hicks contractions. These false labor contractions are sometimes confused with true labor. °What are Braxton Hicks contractions? °Braxton Hicks contractions are tightening movements that occur in the muscles of the uterus before labor. Unlike true labor contractions, these contractions do not result in opening (dilation) and thinning of the cervix. Toward the end of pregnancy (32-34 weeks), Braxton Hicks contractions can happen more often and may become stronger. These contractions are sometimes difficult to tell apart from true labor because they can be very uncomfortable. You should not feel embarrassed if you go to the hospital with false labor. °Sometimes, the only way to tell if you are in true labor is for your health care provider to look for changes in the cervix. The health care provider will do a physical exam and may monitor your contractions. If you are not in true labor, the exam should show that your cervix is not dilating and your water has not broken. °If there are other health problems associated with your pregnancy, it is completely safe for you to be sent home with false labor. You may continue to have Braxton Hicks contractions until you go into true labor. °How to tell the difference between true labor and false labor °True labor °· Contractions last 30-70 seconds. °· Contractions become very regular. °· Discomfort is usually felt in the top of the uterus, and it spreads to the lower abdomen and low back. °· Contractions do not go away with walking. °· Contractions usually become more intense and increase in frequency. °· The cervix dilates and gets thinner. °False labor °· Contractions are usually shorter and not as strong as true labor contractions. °· Contractions are usually irregular. °· Contractions  are often felt in the front of the lower abdomen and in the groin. °· Contractions may go away when you walk around or change positions while lying down. °· Contractions get weaker and are shorter-lasting as time goes on. °· The cervix usually does not dilate or become thin. °Follow these instructions at home: °· Take over-the-counter and prescription medicines only as told by your health care provider. °· Keep up with your usual exercises and follow other instructions from your health care provider. °· Eat and drink lightly if you think you are going into labor. °· If Braxton Hicks contractions are making you uncomfortable: °? Change your position from lying down or resting to walking, or change from walking to resting. °? Sit and rest in a tub of warm water. °? Drink enough fluid to keep your urine pale yellow. Dehydration may cause these contractions. °? Do slow and deep breathing several times an hour. °· Keep all follow-up prenatal visits as told by your health care provider. This is important. °Contact a health care provider if: °· You have a fever. °· You have continuous pain in your abdomen. °Get help right away if: °· Your contractions become stronger, more regular, and closer together. °· You have fluid leaking or gushing from your vagina. °· You have bleeding from your vagina. °· You have low back pain that you never had before. °· You feel your baby’s head pushing down and causing pelvic pressure. °· Your baby is not moving inside you as much as it used to. °Summary °· Contractions that occur before labor are called Braxton Hicks contractions, false labor, or practice contractions. °·   Braxton Hicks contractions are usually shorter, weaker, farther apart, and less regular than true labor contractions. True labor contractions usually become progressively stronger and regular and they become more frequent. °· Manage discomfort from Braxton Hicks contractions by changing position, resting in a warm bath,  drinking plenty of water, or practicing deep breathing. °This information is not intended to replace advice given to you by your health care provider. Make sure you discuss any questions you have with your health care provider. °Document Released: 08/30/2016 Document Revised: 08/30/2016 Document Reviewed: 08/30/2016 °Elsevier Interactive Patient Education © 2018 [employer] ° °

## 2017-06-04 ENCOUNTER — Other Ambulatory Visit: Payer: Self-pay

## 2017-06-04 ENCOUNTER — Inpatient Hospital Stay (HOSPITAL_COMMUNITY): Admitting: Anesthesiology

## 2017-06-04 ENCOUNTER — Inpatient Hospital Stay (HOSPITAL_COMMUNITY)
Admission: AD | Admit: 2017-06-04 | Discharge: 2017-06-07 | DRG: 807 | Disposition: A | Discharge status: Home / Self Care | Source: Ambulatory Visit | Attending: Obstetrics & Gynecology | Admitting: Obstetrics & Gynecology

## 2017-06-04 ENCOUNTER — Encounter (HOSPITAL_COMMUNITY): Payer: Self-pay | Admitting: *Deleted

## 2017-06-04 DIAGNOSIS — Z3490 Encounter for supervision of normal pregnancy, unspecified, unspecified trimester: Secondary | ICD-10-CM

## 2017-06-04 DIAGNOSIS — O99284 Endocrine, nutritional and metabolic diseases complicating childbirth: Secondary | ICD-10-CM | POA: Diagnosis present

## 2017-06-04 DIAGNOSIS — O9A213 Injury, poisoning and certain other consequences of external causes complicating pregnancy, third trimester: Secondary | ICD-10-CM

## 2017-06-04 DIAGNOSIS — Z3A39 39 weeks gestation of pregnancy: Secondary | ICD-10-CM

## 2017-06-04 DIAGNOSIS — Z3483 Encounter for supervision of other normal pregnancy, third trimester: Secondary | ICD-10-CM | POA: Diagnosis present

## 2017-06-04 DIAGNOSIS — E039 Hypothyroidism, unspecified: Secondary | ICD-10-CM | POA: Diagnosis present

## 2017-06-04 DIAGNOSIS — O471 False labor at or after 37 completed weeks of gestation: Secondary | ICD-10-CM

## 2017-06-04 HISTORY — DX: Tubulo-interstitial nephritis, not specified as acute or chronic: N12

## 2017-06-04 HISTORY — DX: Anxiety disorder, unspecified: F41.9

## 2017-06-04 HISTORY — DX: Depression, unspecified: F32.A

## 2017-06-04 HISTORY — DX: Major depressive disorder, single episode, unspecified: F32.9

## 2017-06-04 HISTORY — DX: Mental disorder, not otherwise specified: F99

## 2017-06-04 HISTORY — DX: Chlamydial infection, unspecified: A74.9

## 2017-06-04 HISTORY — DX: Other maternal infectious and parasitic diseases complicating pregnancy, first trimester: O98.811

## 2017-06-04 LAB — TYPE AND SCREEN
ABO/RH(D): O POS
ANTIBODY SCREEN: NEGATIVE

## 2017-06-04 LAB — CBC
HCT: 30.9 % — ABNORMAL LOW (ref 36.0–46.0)
HEMOGLOBIN: 10 g/dL — AB (ref 12.0–15.0)
MCH: 27.4 pg (ref 26.0–34.0)
MCHC: 32.4 g/dL (ref 30.0–36.0)
MCV: 84.7 fL (ref 78.0–100.0)
PLATELETS: 376 10*3/uL (ref 150–400)
RBC: 3.65 MIL/uL — AB (ref 3.87–5.11)
RDW: 13.9 % (ref 11.5–15.5)
WBC: 14.9 10*3/uL — ABNORMAL HIGH (ref 4.0–10.5)

## 2017-06-04 MED ORDER — FENTANYL 2.5 MCG/ML BUPIVACAINE 1/10 % EPIDURAL INFUSION (WH - ANES)
14.0000 mL/h | INTRAMUSCULAR | Status: DC | PRN
  Administered 2017-06-04 (×2): 14 mL/h via EPIDURAL
  Filled 2017-06-04: qty 100

## 2017-06-04 MED ORDER — LACTATED RINGERS IV SOLN
500.0000 mL | INTRAVENOUS | Status: DC | PRN
Start: 2017-06-04 — End: 2017-06-05

## 2017-06-04 MED ORDER — EPHEDRINE 5 MG/ML INJ
10.0000 mg | INTRAVENOUS | Status: DC | PRN
  Filled 2017-06-04: qty 2

## 2017-06-04 MED ORDER — LIDOCAINE HCL (PF) 1 % IJ SOLN
30.0000 mL | INTRAMUSCULAR | Status: DC | PRN
  Filled 2017-06-04: qty 30

## 2017-06-04 MED ORDER — SOD CITRATE-CITRIC ACID 500-334 MG/5ML PO SOLN: 30.0000 mL | ORAL | Status: DC | PRN

## 2017-06-04 MED ORDER — LACTATED RINGERS IV SOLN
500.0000 mL | Freq: Once | INTRAVENOUS | Status: AC
  Administered 2017-06-04: 500 mL via INTRAVENOUS

## 2017-06-04 MED ORDER — OXYTOCIN 40 UNITS IN LACTATED RINGERS INFUSION - SIMPLE MED
1.0000 m[IU]/min | INTRAVENOUS | Status: DC
  Administered 2017-06-04: 2 m[IU]/min via INTRAVENOUS
  Administered 2017-06-04: 4 m[IU]/min via INTRAVENOUS
  Administered 2017-06-04: 6 m[IU]/min via INTRAVENOUS

## 2017-06-04 MED ORDER — OXYTOCIN 40 UNITS IN LACTATED RINGERS INFUSION - SIMPLE MED
2.5000 [IU]/h | INTRAVENOUS | Status: DC
  Administered 2017-06-05: 2.5 [IU]/h via INTRAVENOUS
  Filled 2017-06-04: qty 1000

## 2017-06-04 MED ORDER — FLEET ENEMA 7-19 GM/118ML RE ENEM: 1.0000 | ENEMA | RECTAL | Status: DC | PRN

## 2017-06-04 MED ORDER — FENTANYL CITRATE (PF) 100 MCG/2ML IJ SOLN: 50.0000 ug | INTRAMUSCULAR | Status: DC | PRN

## 2017-06-04 MED ORDER — OXYCODONE-ACETAMINOPHEN 5-325 MG PO TABS: 2.0000 | ORAL_TABLET | ORAL | Status: DC | PRN

## 2017-06-04 MED ORDER — FENTANYL 2.5 MCG/ML BUPIVACAINE 1/10 % EPIDURAL INFUSION (WH - ANES)
INTRAMUSCULAR | Status: AC
  Filled 2017-06-04: qty 100

## 2017-06-04 MED ORDER — ACETAMINOPHEN 325 MG PO TABS: 650.0000 mg | ORAL_TABLET | ORAL | Status: DC | PRN

## 2017-06-04 MED ORDER — OXYTOCIN BOLUS FROM INFUSION
500.0000 mL | Freq: Once | INTRAVENOUS | Status: AC
  Administered 2017-06-05: 500 mL via INTRAVENOUS

## 2017-06-04 MED ORDER — PHENYLEPHRINE 40 MCG/ML (10ML) SYRINGE FOR IV PUSH (FOR BLOOD PRESSURE SUPPORT)
80.0000 ug | PREFILLED_SYRINGE | INTRAVENOUS | Status: DC | PRN
  Filled 2017-06-04: qty 5

## 2017-06-04 MED ORDER — ONDANSETRON HCL 4 MG/2ML IJ SOLN: 4.0000 mg | Freq: Four times a day (QID) | INTRAMUSCULAR | Status: DC | PRN

## 2017-06-04 MED ORDER — OXYCODONE-ACETAMINOPHEN 5-325 MG PO TABS: 1.0000 | ORAL_TABLET | ORAL | Status: DC | PRN

## 2017-06-04 MED ORDER — TERBUTALINE SULFATE 1 MG/ML IJ SOLN
0.2500 mg | Freq: Once | INTRAMUSCULAR | Status: DC | PRN
  Filled 2017-06-04: qty 1

## 2017-06-04 MED ORDER — DIPHENHYDRAMINE HCL 50 MG/ML IJ SOLN: 12.5000 mg | INTRAMUSCULAR | Status: DC | PRN

## 2017-06-04 MED ORDER — LIDOCAINE HCL (PF) 1 % IJ SOLN
INTRAMUSCULAR | Status: DC | PRN
  Administered 2017-06-04 (×2): 5 mL via EPIDURAL

## 2017-06-04 MED ORDER — LACTATED RINGERS IV SOLN
INTRAVENOUS | Status: DC
  Administered 2017-06-04 (×2): via INTRAVENOUS

## 2017-06-04 MED ORDER — PHENYLEPHRINE 40 MCG/ML (10ML) SYRINGE FOR IV PUSH (FOR BLOOD PRESSURE SUPPORT)
PREFILLED_SYRINGE | INTRAVENOUS | Status: AC
  Filled 2017-06-04: qty 20

## 2017-06-04 NOTE — MAU Note (Signed)
I have communicated with Dr. Rana SnareLowe and reviewed vital signs:  Vitals:   06/03/17 2350 06/04/17 0006  BP:  103/65  Pulse:  80  Resp: 15 15  Temp:      Vaginal exam:  Dilation: 2 Effacement (%): 90 Cervical Position: Posterior Station: -3, -2 Presentation: Vertex Exam by:: K. Marijo FileWEissRn,   Also reviewed contraction pattern and that non-stress test is reactive.  It has been documented that patient is contracting rarely with  Cervix 2cm indicating pt not in labor. Pt denies any other complaints.  Based on this report provider has given order for discharge.  A discharge order and diagnosis entered by a provider.   Labor discharge instructions reviewed with patient.

## 2017-06-04 NOTE — Anesthesia Pain Management Evaluation Note (Signed)
  CRNA Pain Management Visit Note  Patient: Liston Albamily R Robitaille, 27 y.o., female  "Hello I am a member of the anesthesia team at [facility]. We have an anesthesia team available at all times to provide care throughout the hospital, including epidural management and anesthesia for C-section. I don't know your plan for the delivery whether it a natural birth, water birth, IV sedation, nitrous supplementation, doula or epidural, but we want to meet your pain goals."   1.Was your pain managed to your expectations on prior hospitalizations?   No prior hospitalizations  2.What is your expectation for pain management during this hospitalization?     Epidural  3.How can we help you reach that goal? epidural  Record the patient's initial score and the patient's pain goal.   Pain: 0  Pain Goal: 4 [facility] wants you to be able to say your pain was always managed very well.  Katrina Daddona 06/04/2017

## 2017-06-04 NOTE — Anesthesia Preprocedure Evaluation (Signed)
Anesthesia Evaluation  Patient identified by MRN, date of birth, ID band Patient awake    Reviewed: Allergy & Precautions, NPO status , Patient's Chart, lab work & pertinent test results  History of Anesthesia Complications Negative for: history of anesthetic complications  Airway Mallampati: II  TM Distance: >3 FB Neck ROM: Full    Dental no notable dental hx. (+) Dental Advisory Given   Pulmonary neg pulmonary ROS,    Pulmonary exam normal        Cardiovascular negative cardio ROS Normal cardiovascular exam     Neuro/Psych negative neurological ROS  negative psych ROS   GI/Hepatic negative GI ROS, Neg liver ROS,   Endo/Other  Hypothyroidism   Renal/GU negative Renal ROS  negative genitourinary   Musculoskeletal negative musculoskeletal ROS (+)   Abdominal   Peds negative pediatric ROS (+)  Hematology negative hematology ROS (+)   Anesthesia Other Findings   Reproductive/Obstetrics (+) Pregnancy                             Anesthesia Physical Anesthesia Plan  ASA: II  Anesthesia Plan: Epidural   Post-op Pain Management:    Induction:   PONV Risk Score and Plan:   Airway Management Planned: Natural Airway  Additional Equipment:   Intra-op Plan:   Post-operative Plan:   Informed Consent: I have reviewed the patients History and Physical, chart, labs and discussed the procedure including the risks, benefits and alternatives for the proposed anesthesia with the patient or authorized representative who has indicated his/her understanding and acceptance.   Dental advisory given  Plan Discussed with: Anesthesiologist  Anesthesia Plan Comments:         Anesthesia Quick Evaluation

## 2017-06-04 NOTE — H&P (Signed)
Linda Dominguez is a 27 y.o. female presenting for labor; CTX started at 3am.  No LOF or VB.  Active FM.  Antepartum complicated by hypothyroid; on Synthroid.  Chlamydia diagnosed early in pregnancy with negative TOC.  GBS negative.  Comfortable with epidural.  OB History    Gravida Para Term Preterm AB Living   1 0 0 0   0   SAB TAB Ectopic Multiple Live Births                 Past Medical History:  Diagnosis Date  . Anxiety   . Chlamydia infection affecting pregnancy in first trimester   . Concussion    She's had 3, last one was when she was 20  . Depression   . Hypothyroidism 05/01/2015  . Mental disorder   . Pyelonephritis    Past Surgical History:  Procedure Laterality Date  . COLONOSCOPY WITH PROPOFOL N/A 05/06/2015   Procedure: COLONOSCOPY WITH PROPOFOL;  Surgeon: Wallace CullensPaul Y Oh, MD;  [facility];  Service: Gastroenterology;  Laterality: N/A;  . TONSILLECTOMY AND ADENOIDECTOMY  1995   Family History: family history includes Alcohol abuse in her maternal grandmother; Depression in her father; Diabetes in her maternal grandfather and maternal grandmother; Early death in her paternal grandmother; Heart disease in her maternal grandfather and maternal grandmother; Hyperlipidemia in her maternal grandmother; Mental illness in her father; Stroke in her maternal grandfather. Social History:  reports that  has never smoked. she has never used smokeless tobacco. She reports that she does not drink alcohol or use drugs.     Maternal Diabetes: No Genetic Screening: Normal Maternal Ultrasounds/Referrals: Normal Fetal Ultrasounds or other Referrals:  None Maternal Substance Abuse:  No Significant Maternal Medications:  Meds include: Syntroid Significant Maternal Lab Results:  Lab values include: Group B Strep negative Other Comments:  None  ROS Maternal Medical History:  Reason for admission: Contractions.   Contractions: Onset was 13-24 hours ago.   Frequency: regular.    Perceived severity is moderate.    Fetal activity: Perceived fetal activity is normal.   Last perceived fetal movement was within the past hour.    Prenatal complications: no prenatal complications Prenatal Complications - Diabetes: none.    Dilation: 7 Effacement (%): 80 Station: 0 Exam by:: katherine g jones RN  Blood pressure 118/74, pulse 75, temperature 97.6 F (36.4 C), temperature source Oral, resp. rate 20, height 5\' 5"  (1.651 m), weight 201 lb (91.2 kg), last menstrual period 09/08/2016, SpO2 98 %. Maternal Exam:  Uterine Assessment: Contraction strength is moderate.  Contraction frequency is regular.   Abdomen: Patient reports no abdominal tenderness. Fundal height is c/w dates.   Estimated fetal weight is 8#.       Physical Exam  Constitutional: She is oriented to person, place, and time. She appears well-developed and well-nourished.  GI: Soft. There is no rebound and no guarding.  Neurological: She is alert and oriented to person, place, and time.  Skin: Skin is warm and dry.  Psychiatric: She has a normal mood and affect. Her behavior is normal.    Prenatal labs: ABO, Rh: --/--/O POS (02/05 1055) Antibody: NEG (02/05 1055) Rubella: Immune (07/18 0000) RPR: Nonreactive (07/18 0000)  HBsAg: Negative (07/18 0000)  HIV: Non-reactive (07/18 0000)  GBS:     Assessment/Plan: 26yo G1 at [id number] with labor -AROM when ready (patient is waiting on her husband to return) -Anticipate NSVD   Antoino Westhoff 06/04/2017, 1:17 PM

## 2017-06-04 NOTE — Anesthesia Procedure Notes (Signed)
Epidural Patient location during procedure: OB Start time: 06/04/2017 11:29 AM End time: 06/04/2017 11:42 AM  Staffing Anesthesiologist: Heather RobertsSinger, Solae Norling, MD Performed: anesthesiologist   Preanesthetic Checklist Completed: patient identified, site marked, pre-op evaluation, timeout performed, IV checked, risks and benefits discussed and monitors and equipment checked  Epidural Patient position: sitting Prep: DuraPrep Patient monitoring: heart rate, cardiac monitor, continuous pulse ox and blood pressure Approach: midline Location: L2-L3 Injection technique: LOR saline  Needle:  Needle type: Tuohy  Needle gauge: 17 G Needle length: 9 cm Needle insertion depth: 5 cm Catheter size: 20 Guage Catheter at skin depth: 10 cm Test dose: negative and Other  Assessment Events: blood not aspirated, injection not painful, no injection resistance and negative IV test  Additional Notes Informed consent obtained prior to proceeding including risk of failure, 1% risk of PDPH, risk of minor discomfort and bruising.  Discussed rare but serious complications including epidural abscess, permanent nerve injury, epidural hematoma.  Discussed alternatives to epidural analgesia and patient desires to proceed.  Timeout performed pre-procedure verifying patient name, procedure, and platelet count.  Patient tolerated procedure well.

## 2017-06-04 NOTE — MAU Note (Signed)
First baby, assisted to rm via wc.  Contractions, no bleeding or leaking. Was 2 cm yesterday.

## 2017-06-04 NOTE — Discharge Instructions (Signed)
Braxton Hicks Contractions °Contractions of the uterus can occur throughout pregnancy, but they are not always a sign that you are in labor. You may have practice contractions called Braxton Hicks contractions. These false labor contractions are sometimes confused with true labor. °What are Braxton Hicks contractions? °Braxton Hicks contractions are tightening movements that occur in the muscles of the uterus before labor. Unlike true labor contractions, these contractions do not result in opening (dilation) and thinning of the cervix. Toward the end of pregnancy (32-34 weeks), Braxton Hicks contractions can happen more often and may become stronger. These contractions are sometimes difficult to tell apart from true labor because they can be very uncomfortable. You should not feel embarrassed if you go to the hospital with false labor. °Sometimes, the only way to tell if you are in true labor is for your health care provider to look for changes in the cervix. The health care provider will do a physical exam and may monitor your contractions. If you are not in true labor, the exam should show that your cervix is not dilating and your water has not broken. °If there are other health problems associated with your pregnancy, it is completely safe for you to be sent home with false labor. You may continue to have Braxton Hicks contractions until you go into true labor. °How to tell the difference between true labor and false labor °True labor °· Contractions last 30-70 seconds. °· Contractions become very regular. °· Discomfort is usually felt in the top of the uterus, and it spreads to the lower abdomen and low back. °· Contractions do not go away with walking. °· Contractions usually become more intense and increase in frequency. °· The cervix dilates and gets thinner. °False labor °· Contractions are usually shorter and not as strong as true labor contractions. °· Contractions are usually irregular. °· Contractions  are often felt in the front of the lower abdomen and in the groin. °· Contractions may go away when you walk around or change positions while lying down. °· Contractions get weaker and are shorter-lasting as time goes on. °· The cervix usually does not dilate or become thin. °Follow these instructions at home: °· Take over-the-counter and prescription medicines only as told by your health care provider. °· Keep up with your usual exercises and follow other instructions from your health care provider. °· Eat and drink lightly if you think you are going into labor. °· If Braxton Hicks contractions are making you uncomfortable: °? Change your position from lying down or resting to walking, or change from walking to resting. °? Sit and rest in a tub of warm water. °? Drink enough fluid to keep your urine pale yellow. Dehydration may cause these contractions. °? Do slow and deep breathing several times an hour. °· Keep all follow-up prenatal visits as told by your health care provider. This is important. °Contact a health care provider if: °· You have a fever. °· You have continuous pain in your abdomen. °Get help right away if: °· Your contractions become stronger, more regular, and closer together. °· You have fluid leaking or gushing from your vagina. °· You pass blood-tinged mucus (bloody show). °· You have bleeding from your vagina. °· You have low back pain that you never had before. °· You feel your baby’s head pushing down and causing pelvic pressure. °· Your baby is not moving inside you as much as it used to. °Summary °· Contractions that occur before labor are called Braxton   Hicks contractions, false labor, or practice contractions. °· Braxton Hicks contractions are usually shorter, weaker, farther apart, and less regular than true labor contractions. True labor contractions usually become progressively stronger and regular and they become more frequent. °· Manage discomfort from Braxton Hicks contractions by  changing position, resting in a warm bath, drinking plenty of water, or practicing deep breathing. °This information is not intended to replace advice given to you by your health care provider. Make sure you discuss any questions you have with your health care provider. °Document Released: 08/30/2016 Document Revised: 08/30/2016 Document Reviewed: 08/30/2016 °Elsevier Interactive Patient Education © 2018 [employer] Inc. ° °

## 2017-06-05 ENCOUNTER — Encounter (HOSPITAL_COMMUNITY): Payer: Self-pay

## 2017-06-05 LAB — RPR: RPR Ser Ql: NONREACTIVE

## 2017-06-05 MED ORDER — ONDANSETRON HCL 4 MG/2ML IJ SOLN: 4.0000 mg | INTRAMUSCULAR | Status: DC | PRN

## 2017-06-05 MED ORDER — PRENATAL MULTIVITAMIN CH
1.0000 | ORAL_TABLET | Freq: Every day | ORAL | Status: DC
  Administered 2017-06-05 – 2017-06-07 (×3): 1 via ORAL
  Filled 2017-06-05 (×3): qty 1

## 2017-06-05 MED ORDER — ACETAMINOPHEN 325 MG PO TABS: 650.0000 mg | ORAL_TABLET | ORAL | Status: DC | PRN

## 2017-06-05 MED ORDER — ONDANSETRON HCL 4 MG PO TABS: 4.0000 mg | ORAL_TABLET | ORAL | Status: DC | PRN

## 2017-06-05 MED ORDER — OXYCODONE-ACETAMINOPHEN 5-325 MG PO TABS: 1.0000 | ORAL_TABLET | ORAL | Status: DC | PRN

## 2017-06-05 MED ORDER — WITCH HAZEL-GLYCERIN EX PADS: 1.0000 "application " | MEDICATED_PAD | CUTANEOUS | Status: DC | PRN

## 2017-06-05 MED ORDER — DIBUCAINE 1 % RE OINT: 1.0000 "application " | TOPICAL_OINTMENT | RECTAL | Status: DC | PRN

## 2017-06-05 MED ORDER — ZOLPIDEM TARTRATE 5 MG PO TABS: 5.0000 mg | ORAL_TABLET | Freq: Every evening | ORAL | Status: DC | PRN

## 2017-06-05 MED ORDER — OXYCODONE-ACETAMINOPHEN 5-325 MG PO TABS: 2.0000 | ORAL_TABLET | ORAL | Status: DC | PRN

## 2017-06-05 MED ORDER — IBUPROFEN 600 MG PO TABS
600.0000 mg | ORAL_TABLET | Freq: Four times a day (QID) | ORAL | Status: DC
  Administered 2017-06-05 – 2017-06-07 (×9): 600 mg via ORAL
  Filled 2017-06-05 (×9): qty 1

## 2017-06-05 MED ORDER — DIPHENHYDRAMINE HCL 25 MG PO CAPS: 25.0000 mg | ORAL_CAPSULE | Freq: Four times a day (QID) | ORAL | Status: DC | PRN

## 2017-06-05 MED ORDER — SIMETHICONE 80 MG PO CHEW: 80.0000 mg | CHEWABLE_TABLET | ORAL | Status: DC | PRN

## 2017-06-05 MED ORDER — SENNOSIDES-DOCUSATE SODIUM 8.6-50 MG PO TABS
2.0000 | ORAL_TABLET | ORAL | Status: DC
  Administered 2017-06-06 (×2): 2 via ORAL
  Filled 2017-06-05 (×2): qty 2

## 2017-06-05 MED ORDER — LEVOTHYROXINE SODIUM 75 MCG PO TABS
75.0000 ug | ORAL_TABLET | Freq: Every day | ORAL | Status: DC
  Administered 2017-06-05 – 2017-06-07 (×2): 75 ug via ORAL
  Filled 2017-06-05 (×3): qty 1

## 2017-06-05 MED ORDER — BENZOCAINE-MENTHOL 20-0.5 % EX AERO
1.0000 "application " | INHALATION_SPRAY | CUTANEOUS | Status: DC | PRN
  Filled 2017-06-05: qty 56

## 2017-06-05 MED ORDER — COCONUT OIL OIL: 1.0000 "application " | TOPICAL_OIL | Status: DC | PRN

## 2017-06-05 MED ORDER — TETANUS-DIPHTH-ACELL PERTUSSIS 5-2.5-18.5 LF-MCG/0.5 IM SUSP: 0.5000 mL | Freq: Once | INTRAMUSCULAR | Status: DC

## 2017-06-05 NOTE — Anesthesia Postprocedure Evaluation (Signed)
Anesthesia Post Note  Patient: Linda Dominguez  Procedure(s) Performed: AN AD HOC LABOR EPIDURAL     Patient location during evaluation: Mother Baby Anesthesia Type: Epidural Level of consciousness: awake Pain management: pain level controlled Vital Signs Assessment: post-procedure vital signs reviewed and stable Respiratory status: spontaneous breathing Cardiovascular status: stable Postop Assessment: no headache, no backache, epidural receding, patient able to bend at knees, no apparent nausea or vomiting and adequate PO intake Anesthetic complications: no    Last Vitals:  Vitals:   06/05/17 0420 06/05/17 0530  BP: 120/72 111/65  Pulse: (!) 105 89  Resp: 20 18  Temp: 36.6 C   SpO2:      Last Pain:  Vitals:   06/05/17 0420  TempSrc: Oral  PainSc:    Pain Goal:                 [name]

## 2017-06-05 NOTE — Progress Notes (Signed)
Post Partum Day 0 Subjective: no complaints, up ad lib and tolerating PO  Objective: Blood pressure 111/65, pulse 89, temperature 97.9 F (36.6 C), temperature source Oral, resp. rate 18, height 5\' 5"  (1.651 m), weight 91.2 kg (201 lb), last menstrual period 09/08/2016, SpO2 99 %, unknown if currently breastfeeding.  Physical Exam:  General: alert, cooperative and appears stated age Lochia: appropriate Uterine Fundus: firm Incision: healing well, no significant drainage, no dehiscence, no significant erythema DVT Evaluation: No evidence of DVT seen on physical exam. Negative Homan's sign. No cords or calf tenderness. No significant calf/ankle edema.  Recent Labs    06/04/17 1055  HGB 10.0*  HCT 30.9*    Assessment/Plan: Breastfeeding - PPD#0 s/s NSVD. Pregnancy c/b Hypothyroidism and chlamydia (neg TOC). Patient undecided regarding inpatient vs outpatient circ - pending insurance issues. Will decide by PPD#1. Continue current care.    LOS: 1 day   Ranae Pilalise Jennifer Leger 06/05/2017, 9:33 AM

## 2017-06-05 NOTE — Lactation Note (Signed)
This note was copied from a baby's chart. Lactation Consultation Note  Patient Name: Boy Glendora Scoremily Araque WGNFA'OToday's Date: 06/05/2017   P1, Baby 16 hours old.   Reviewed hand expression, mother expressed drops. Baby STS on mother. With assistance baby latched in football hold with sucks and swallows observed. Mom encouraged to feed baby 8-12 times/24 hours and with feeding cues.  Mom made aware of O/P services, breastfeeding support groups, community resources, and our phone # for post-discharge questions.         Maternal Data    Feeding Feeding Type: Breast Milk  LATCH Score Latch: Too sleepy or reluctant, no latch achieved, no sucking elicited.  Audible Swallowing: None  Type of Nipple: Everted at rest and after stimulation  Comfort (Breast/Nipple): Soft / non-tender  Hold (Positioning): Assistance needed to correctly position infant at breast and maintain latch.  LATCH Score: 5  Interventions    Lactation Tools Discussed/Used     Consult Status      Hardie PulleyBerkelhammer, Piercen Covino Boschen 06/05/2017, 5:43 PM

## 2017-06-06 LAB — CBC
HEMATOCRIT: 24.3 % — AB (ref 36.0–46.0)
Hemoglobin: 8 g/dL — ABNORMAL LOW (ref 12.0–15.0)
MCH: 27.8 pg (ref 26.0–34.0)
MCHC: 32.9 g/dL (ref 30.0–36.0)
MCV: 84.4 fL (ref 78.0–100.0)
Platelets: 361 10*3/uL (ref 150–400)
RBC: 2.88 MIL/uL — AB (ref 3.87–5.11)
RDW: 14.1 % (ref 11.5–15.5)
WBC: 16.6 10*3/uL — AB (ref 4.0–10.5)

## 2017-06-06 NOTE — Plan of Care (Signed)
Progressing appropriately. Encouraged to call for assistance with feeding as needed and [facility]. Expresses good comfort level with breastfeeding.

## 2017-06-06 NOTE — Progress Notes (Signed)
Post Partum Day 1 Subjective: no complaints  Objective: Blood pressure 114/79, pulse 60, temperature 97.8 F (36.6 C), temperature source Axillary, resp. rate 17, height 5\' 5"  (1.651 m), weight 201 lb (91.2 kg), last menstrual period 09/08/2016, SpO2 99 %, unknown if currently breastfeeding.  Physical Exam:  General: alert and cooperative Lochia: appropriate Uterine Fundus: firm Incision: n/a DVT Evaluation: No evidence of DVT seen on physical exam.  Recent Labs    06/04/17 1055 06/06/17 0534  HGB 10.0* 8.0*  HCT 30.9* 24.3*    Assessment/Plan: Plan for discharge tomorrow  Desires circ - risks reviewed, questions answered, informed consent   LOS: 2 days   Zelphia CairoGretchen Dory Verdun 06/06/2017, 9:39 AM

## 2017-06-07 NOTE — Discharge Summary (Signed)
Obstetric Discharge Summary Reason for Admission: onset of labor Prenatal Procedures: none Intrapartum Procedures: spontaneous vaginal delivery Postpartum Procedures: none Complications-Operative and Postpartum: 2 degree perineal laceration Hemoglobin  Date Value Ref Range Status  06/06/2017 8.0 (L) 12.0 - 15.0 g/dL Final  66/44/034705/07/2016 42.512.8 11.1 - 15.9 g/dL Final   HCT  Date Value Ref Range Status  06/06/2017 24.3 (L) 36.0 - 46.0 % Final   Hematocrit  Date Value Ref Range Status  08/31/2016 36.8 34.0 - 46.6 % Final    Physical Exam:  General: alert, cooperative, appears stated age and no distress Lochia: appropriate Uterine Fundus: firm Incision: healing well DVT Evaluation: No evidence of DVT seen on physical exam.  Discharge Diagnoses: Term Pregnancy-delivered  Discharge Information: Date: 06/07/2017 Activity: pelvic rest Diet: routine Medications: None Condition: stable Instructions: refer to practice specific booklet Discharge to: home   Newborn Data: Live born female  Birth Weight: 9 lb 1.2 oz (4116 g) APGAR: 8, 9  Newborn Delivery   Birth date/time:  06/05/2017 01:30:00 Delivery type:  Vaginal, Spontaneous     Home with mother.  Linda Dominguez C 06/07/2017, 9:30 AM

## 2017-06-07 NOTE — Lactation Note (Addendum)
This note was copied from a baby's chart. Lactation Consultation Note; Mom getting ready to latch baby as I went into room. She reports he has been latching well with no pain. She latched him to the breast easily with little assist from me. I did encourage her to keep him close to the breast throughout the feeding. He does some clicking while at the breast, she adjusts his chin and it stops. Reports breasts are feeling heavier this morning.  Reviewed engorgement prevention and treatment. Has DEBP at home but unsure of brand. Discussed when to start pumping and bottle feeding EBM. Reviewed our phone number, OP appointments and BFSG as resources for support after DC. No further questions at present. To call prn Still nursing on the second breast as I left room. No stool for greater than 24 hours. Ped aware.  Patient Name: Linda Dominguez [id number]'s Date: 06/07/2017 Reason for consult: Follow-up assessment   Maternal Data Formula Feeding for Exclusion: No Has patient been taught Hand Expression?: Yes Does the patient have breastfeeding experience prior to this delivery?: No  Feeding Feeding Type: Breast Fed Length of feed: 10 min  LATCH Score Latch: Grasps breast easily, tongue down, lips flanged, rhythmical sucking.  Audible Swallowing: Spontaneous and intermittent  Type of Nipple: Everted at rest and after stimulation  Comfort (Breast/Nipple): Soft / non-tender  Hold (Positioning): Assistance needed to correctly position infant at breast and maintain latch.  LATCH Score: 9  Interventions Interventions: Breast feeding basics reviewed  Lactation Tools Discussed/Used WIC Program: No   Consult Status Consult Status: Complete    Pamelia HoitWeeks, Lin Hackmann D 06/07/2017, 9:48 AM
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
## 2018-04-30 NOTE — L&D Delivery Note (Signed)
Delivery Note At 1:08 PM a viable female was delivered via Vaginal, Spontaneous  Presentation vertex Apgars pending Weight pending Placenta routine Cord PH not sent  Complications nuchal x 1    Anesthesia:  CLE Episiotomy: None Lacerations:  First degree Suture Repair: 3.0 vicryl Est. Blood Loss (mL): 107   It's a boy to join brother!!   Mom to postpartum.  Baby to Couplet care / Skin to Skin.  Colin Benton Sherly Brodbeck 01/15/2019, 1:24 PM

## 2018-06-05 DIAGNOSIS — N911 Secondary amenorrhea: Secondary | ICD-10-CM | POA: Diagnosis not present

## 2018-06-19 DIAGNOSIS — E039 Hypothyroidism, unspecified: Secondary | ICD-10-CM | POA: Diagnosis not present

## 2018-06-19 DIAGNOSIS — Z3685 Encounter for antenatal screening for Streptococcus B: Secondary | ICD-10-CM | POA: Diagnosis not present

## 2018-06-19 DIAGNOSIS — Z3481 Encounter for supervision of other normal pregnancy, first trimester: Secondary | ICD-10-CM | POA: Diagnosis not present

## 2018-06-19 LAB — OB RESULTS CONSOLE RUBELLA ANTIBODY, IGM: Rubella: IMMUNE

## 2018-06-19 LAB — OB RESULTS CONSOLE RPR: RPR: NONREACTIVE

## 2018-06-19 LAB — OB RESULTS CONSOLE ANTIBODY SCREEN: Antibody Screen: NEGATIVE

## 2018-06-19 LAB — OB RESULTS CONSOLE GC/CHLAMYDIA
Chlamydia: NEGATIVE
Gonorrhea: NEGATIVE

## 2018-06-19 LAB — OB RESULTS CONSOLE ABO/RH: RH Type: POSITIVE

## 2018-06-19 LAB — OB RESULTS CONSOLE HIV ANTIBODY (ROUTINE TESTING): HIV: NONREACTIVE

## 2018-06-19 LAB — OB RESULTS CONSOLE HEPATITIS B SURFACE ANTIGEN: Hepatitis B Surface Ag: NEGATIVE

## 2018-06-26 DIAGNOSIS — Z3400 Encounter for supervision of normal first pregnancy, unspecified trimester: Secondary | ICD-10-CM | POA: Diagnosis not present

## 2018-06-26 DIAGNOSIS — Z113 Encounter for screening for infections with a predominantly sexual mode of transmission: Secondary | ICD-10-CM | POA: Diagnosis not present

## 2018-07-17 DIAGNOSIS — Z3682 Encounter for antenatal screening for nuchal translucency: Secondary | ICD-10-CM | POA: Diagnosis not present

## 2018-07-17 DIAGNOSIS — Z3482 Encounter for supervision of other normal pregnancy, second trimester: Secondary | ICD-10-CM | POA: Diagnosis not present

## 2018-07-17 DIAGNOSIS — Z3A13 13 weeks gestation of pregnancy: Secondary | ICD-10-CM | POA: Diagnosis not present

## 2018-07-17 DIAGNOSIS — Z3A12 12 weeks gestation of pregnancy: Secondary | ICD-10-CM | POA: Diagnosis not present

## 2018-08-26 DIAGNOSIS — E039 Hypothyroidism, unspecified: Secondary | ICD-10-CM | POA: Diagnosis not present

## 2018-08-26 DIAGNOSIS — Z363 Encounter for antenatal screening for malformations: Secondary | ICD-10-CM | POA: Diagnosis not present

## 2018-08-26 DIAGNOSIS — Z3A18 18 weeks gestation of pregnancy: Secondary | ICD-10-CM | POA: Diagnosis not present

## 2018-09-25 DIAGNOSIS — Z3A23 23 weeks gestation of pregnancy: Secondary | ICD-10-CM | POA: Diagnosis not present

## 2018-09-25 DIAGNOSIS — Z362 Encounter for other antenatal screening follow-up: Secondary | ICD-10-CM | POA: Diagnosis not present

## 2018-10-27 DIAGNOSIS — O43192 Other malformation of placenta, second trimester: Secondary | ICD-10-CM | POA: Diagnosis not present

## 2018-10-27 DIAGNOSIS — Z3A27 27 weeks gestation of pregnancy: Secondary | ICD-10-CM | POA: Diagnosis not present

## 2018-10-29 DIAGNOSIS — Z3480 Encounter for supervision of other normal pregnancy, unspecified trimester: Secondary | ICD-10-CM | POA: Diagnosis not present

## 2018-10-29 DIAGNOSIS — Z23 Encounter for immunization: Secondary | ICD-10-CM | POA: Diagnosis not present

## 2018-11-13 ENCOUNTER — Other Ambulatory Visit: Payer: Self-pay | Admitting: Family Medicine

## 2018-11-13 DIAGNOSIS — Z20822 Contact with and (suspected) exposure to covid-19: Secondary | ICD-10-CM

## 2018-11-18 LAB — NOVEL CORONAVIRUS, NAA: SARS-CoV-2, NAA: DETECTED — AB

## 2018-12-24 DIAGNOSIS — Z3685 Encounter for antenatal screening for Streptococcus B: Secondary | ICD-10-CM | POA: Diagnosis not present

## 2018-12-24 LAB — OB RESULTS CONSOLE GBS: GBS: NEGATIVE

## 2019-01-01 DIAGNOSIS — O358XX9 Maternal care for other (suspected) fetal abnormality and damage, other fetus: Secondary | ICD-10-CM | POA: Diagnosis not present

## 2019-01-01 DIAGNOSIS — Z3A37 37 weeks gestation of pregnancy: Secondary | ICD-10-CM | POA: Diagnosis not present

## 2019-01-08 ENCOUNTER — Telehealth (HOSPITAL_COMMUNITY): Payer: Self-pay | Admitting: *Deleted

## 2019-01-08 ENCOUNTER — Encounter (HOSPITAL_COMMUNITY): Payer: Self-pay | Admitting: *Deleted

## 2019-01-08 NOTE — Telephone Encounter (Signed)
Preadmission screen  

## 2019-01-11 ENCOUNTER — Encounter (HOSPITAL_COMMUNITY): Payer: Self-pay | Admitting: *Deleted

## 2019-01-11 ENCOUNTER — Other Ambulatory Visit: Payer: Self-pay

## 2019-01-11 ENCOUNTER — Inpatient Hospital Stay (HOSPITAL_COMMUNITY)
Admission: AD | Admit: 2019-01-11 | Discharge: 2019-01-11 | Disposition: A | Discharge status: Home / Self Care | Payer: BC Managed Care – PPO | Attending: Obstetrics and Gynecology | Admitting: Obstetrics and Gynecology

## 2019-01-11 DIAGNOSIS — O471 False labor at or after 37 completed weeks of gestation: Secondary | ICD-10-CM

## 2019-01-11 DIAGNOSIS — Z3A40 40 weeks gestation of pregnancy: Secondary | ICD-10-CM

## 2019-01-11 NOTE — MAU Note (Signed)
Pt states she has been contracting all day and they became 5 min apart since 1600 today and getting stronger. Denies any vag leaking or bleeding.  Reports fetal movement.

## 2019-01-11 NOTE — MAU Provider Note (Signed)
  S: Ms. Linda Dominguez is a 28 y.o. G2P1001 at [redacted]w[redacted]d  who presents to MAU today complaining contractions q 5 minutes since 1600. She denies vaginal bleeding. She denies LOF. She reports normal fetal movement.    O: BP 116/82 (BP Location: Right Arm)   Pulse 89   Temp 98.3 F (36.8 C) (Oral)   Resp 15   Ht 5\' 4"  (1.626 m)   Wt 94.8 kg   LMP 04/06/2018   BMI 35.87 kg/m  GENERAL: Well-developed, well-nourished female in no acute distress.  HEAD: Normocephalic, atraumatic.  CHEST: Normal effort of breathing, regular heart rate ABDOMEN: Soft, nontender, gravid  Cervical exam:  Dilation: (ext os 3cm , int os 1cm) Effacement (%): 50 Cervical Position: Posterior(VERY) Exam by:: K. WeissRN   Fetal Monitoring: Baseline: 140 Variability: moderate Accelerations: 15x15 Decelerations: none Contractions: none  Patient unchanged from exam in office, no contractions noted on monitoring. Offered to recheck in 1 hour and patient declined. Stating she will follow up in office as scheduled this week.   A: SIUP at [redacted]w[redacted]d  False labor  P: Discharge home -Labor precautions discussed -Patient advised to follow-up with [facility] as scheduled for prenatal care -Patient may return to MAU as needed or if her condition were to change or worsen   Wende Mott, [place] 01/11/2019 11:10 PM

## 2019-01-11 NOTE — Discharge Instructions (Signed)
Braxton Hicks Contractions Contractions of the uterus can occur throughout pregnancy, but they are not always a sign that you are in labor. You may have practice contractions called Braxton Hicks contractions. These false labor contractions are sometimes confused with true labor. What are Braxton Hicks contractions? Braxton Hicks contractions are tightening movements that occur in the muscles of the uterus before labor. Unlike true labor contractions, these contractions do not result in opening (dilation) and thinning of the cervix. Toward the end of pregnancy (32-34 weeks), Braxton Hicks contractions can happen more often and may become stronger. These contractions are sometimes difficult to tell apart from true labor because they can be very uncomfortable. You should not feel embarrassed if you go to the hospital with false labor. Sometimes, the only way to tell if you are in true labor is for your health care provider to look for changes in the cervix. The health care provider will do a physical exam and may monitor your contractions. If you are not in true labor, the exam should show that your cervix is not dilating and your water has not broken. If there are no other health problems associated with your pregnancy, it is completely safe for you to be sent home with false labor. You may continue to have Braxton Hicks contractions until you go into true labor. How to tell the difference between true labor and false labor True labor  Contractions last 30-70 seconds.  Contractions become very regular.  Discomfort is usually felt in the top of the uterus, and it spreads to the lower abdomen and low back.  Contractions do not go away with walking.  Contractions usually become more intense and increase in frequency.  The cervix dilates and gets thinner. False labor  Contractions are usually shorter and not as strong as true labor contractions.  Contractions are usually irregular.  Contractions  are often felt in the front of the lower abdomen and in the groin.  Contractions may go away when you walk around or change positions while lying down.  Contractions get weaker and are shorter-lasting as time goes on.  The cervix usually does not dilate or become thin. Follow these instructions at home:   Take over-the-counter and prescription medicines only as told by your health care provider.  Keep up with your usual exercises and follow other instructions from your health care provider.  Eat and drink lightly if you think you are going into labor.  If Braxton Hicks contractions are making you uncomfortable: ? Change your position from lying down or resting to walking, or change from walking to resting. ? Sit and rest in a tub of warm water. ? Drink enough fluid to keep your urine pale yellow. Dehydration may cause these contractions. ? Do slow and deep breathing several times an hour.  Keep all follow-up prenatal visits as told by your health care provider. This is important. Contact a health care provider if:  You have a fever.  You have continuous pain in your abdomen. Get help right away if:  Your contractions become stronger, more regular, and closer together.  You have fluid leaking or gushing from your vagina.  You pass blood-tinged mucus (bloody show).  You have bleeding from your vagina.  You have low back pain that you never had before.  You feel your baby's head pushing down and causing pelvic pressure.  Your baby is not moving inside you as much as it used to. Summary  Contractions that occur before labor are   called Braxton Hicks contractions, false labor, or practice contractions.  Braxton Hicks contractions are usually shorter, weaker, farther apart, and less regular than true labor contractions. True labor contractions usually become progressively stronger and regular, and they become more frequent.  Manage discomfort from Braxton Hicks contractions  by changing position, resting in a warm bath, drinking plenty of water, or practicing deep breathing. This information is not intended to replace advice given to you by your health care provider. Make sure you discuss any questions you have with your health care provider. Document Released: 08/30/2016 Document Revised: 03/29/2017 Document Reviewed: 08/30/2016 Elsevier Patient Education  2020 Elsevier Inc.  

## 2019-01-12 ENCOUNTER — Telehealth (HOSPITAL_COMMUNITY): Payer: Self-pay | Admitting: *Deleted

## 2019-01-12 ENCOUNTER — Encounter (HOSPITAL_COMMUNITY): Payer: Self-pay | Admitting: *Deleted

## 2019-01-12 NOTE — Pre-Procedure Instructions (Signed)
Notified Valer in MAU of pat arriving tomorrow morning for covid test with a history of positive covid in July.

## 2019-01-12 NOTE — Telephone Encounter (Signed)
Preadmission screen  

## 2019-01-13 ENCOUNTER — Other Ambulatory Visit: Payer: Self-pay

## 2019-01-13 ENCOUNTER — Other Ambulatory Visit (HOSPITAL_COMMUNITY)
Admission: RE | Admit: 2019-01-13 | Discharge: 2019-01-13 | Disposition: A | Discharge status: Home / Self Care | Payer: BC Managed Care – PPO | Source: Ambulatory Visit | Attending: Obstetrics and Gynecology | Admitting: Obstetrics and Gynecology

## 2019-01-13 DIAGNOSIS — Z20828 Contact with and (suspected) exposure to other viral communicable diseases: Secondary | ICD-10-CM | POA: Diagnosis not present

## 2019-01-13 DIAGNOSIS — Z01812 Encounter for preprocedural laboratory examination: Secondary | ICD-10-CM | POA: Insufficient documentation

## 2019-01-13 LAB — SARS CORONAVIRUS 2 (TAT 6-24 HRS): SARS Coronavirus 2: NEGATIVE

## 2019-01-13 NOTE — H&P (Signed)
Linda Dominguez is a 28 y.o. female presenting for IOL. Previous 9#1 infant that was a SVD.  No complications this pregnancy. [place] at 9 wga showed AGA 70%ile. Has hx hypothyroidism and is on levothyroxine. Last TFTs WNL.    OB History    Gravida  2   Para  1   Term  1   Preterm  0   AB      Living  1     SAB      TAB      Ectopic      Multiple  0   Live Births  1          Past Medical History:  Diagnosis Date  . Anxiety   . Chlamydia infection affecting pregnancy in first trimester   . Concussion    She's had 3, last one was when she was 28  . Depression   . Hypothyroidism 05/01/2015  . Mental disorder   . Pyelonephritis    Past Surgical History:  Procedure Laterality Date  . COLONOSCOPY WITH PROPOFOL N/A 05/06/2015   Procedure: COLONOSCOPY WITH PROPOFOL;  Surgeon: Hulen Luster, MD;  Location: [facility];  Service: Gastroenterology;  Laterality: N/A;  . TONSILLECTOMY    . TONSILLECTOMY AND ADENOIDECTOMY  1995   Family History: family history includes Alcohol abuse in her maternal grandmother; Depression in her father; Diabetes in her maternal grandfather and maternal grandmother; Early death in her paternal grandmother; Heart disease in her maternal grandfather and maternal grandmother; Hyperlipidemia in her maternal grandmother; Mental illness in her father; Stroke in her maternal grandfather. Social History:  reports that she has never smoked. She has never used smokeless tobacco. She reports that she does not drink alcohol or use drugs.     Maternal Diabetes: No Genetic Screening: Normal Maternal Ultrasounds/Referrals: Normal Fetal Ultrasounds or other Referrals:  None Maternal Substance Abuse:  No Significant Maternal Medications:  None Significant Maternal Lab Results:  Group B Strep negative Other Comments:  None  ROS History   Last menstrual period 04/06/2018, unknown if currently breastfeeding. Exam Physical Exam  (from office) NAD,  A&O NWOB Abd soft, nondistended, gravid  Prenatal labs: ABO, Rh: O/Positive/-- (02/20 0000) Antibody: Negative (02/20 0000) Rubella: Immune (02/20 0000) RPR: Nonreactive (02/20 0000)  HBsAg: Negative (02/20 0000)  HIV: Non-reactive (02/20 0000)  GBS: Negative/-- (08/26 0000)   Assessment/Plan: 28yo G2P1001 @ 38.5 wga presenting for IOL w/favorable cervix. Plan for Pitocin/AROM. GBS negative.     Linda Dominguez Linda Dominguez 01/13/2019, 1:11 PM

## 2019-01-13 NOTE — MAU Note (Signed)
Asymptomatic, swab collected. 

## 2019-01-15 ENCOUNTER — Inpatient Hospital Stay (HOSPITAL_COMMUNITY): Payer: BC Managed Care – PPO | Admitting: Anesthesiology

## 2019-01-15 ENCOUNTER — Other Ambulatory Visit: Payer: Self-pay

## 2019-01-15 ENCOUNTER — Inpatient Hospital Stay (HOSPITAL_COMMUNITY): Payer: BC Managed Care – PPO

## 2019-01-15 ENCOUNTER — Encounter (HOSPITAL_COMMUNITY): Payer: Self-pay

## 2019-01-15 ENCOUNTER — Inpatient Hospital Stay (HOSPITAL_COMMUNITY)
Admission: AD | Admit: 2019-01-15 | Discharge: 2019-01-16 | DRG: 807 | Disposition: A | Discharge status: Home / Self Care | Payer: BC Managed Care – PPO | Attending: Obstetrics and Gynecology | Admitting: Obstetrics and Gynecology

## 2019-01-15 DIAGNOSIS — Z23 Encounter for immunization: Secondary | ICD-10-CM | POA: Diagnosis not present

## 2019-01-15 DIAGNOSIS — O99214 Obesity complicating childbirth: Secondary | ICD-10-CM | POA: Diagnosis present

## 2019-01-15 DIAGNOSIS — O99284 Endocrine, nutritional and metabolic diseases complicating childbirth: Secondary | ICD-10-CM | POA: Diagnosis not present

## 2019-01-15 DIAGNOSIS — Z3490 Encounter for supervision of normal pregnancy, unspecified, unspecified trimester: Secondary | ICD-10-CM

## 2019-01-15 DIAGNOSIS — O26893 Other specified pregnancy related conditions, third trimester: Secondary | ICD-10-CM | POA: Diagnosis not present

## 2019-01-15 DIAGNOSIS — Z3A38 38 weeks gestation of pregnancy: Secondary | ICD-10-CM | POA: Diagnosis not present

## 2019-01-15 DIAGNOSIS — Z412 Encounter for routine and ritual male circumcision: Secondary | ICD-10-CM | POA: Diagnosis not present

## 2019-01-15 DIAGNOSIS — E039 Hypothyroidism, unspecified: Secondary | ICD-10-CM | POA: Diagnosis not present

## 2019-01-15 DIAGNOSIS — Z3A40 40 weeks gestation of pregnancy: Secondary | ICD-10-CM | POA: Diagnosis not present

## 2019-01-15 DIAGNOSIS — E669 Obesity, unspecified: Secondary | ICD-10-CM | POA: Diagnosis present

## 2019-01-15 DIAGNOSIS — O9902 Anemia complicating childbirth: Secondary | ICD-10-CM | POA: Diagnosis not present

## 2019-01-15 DIAGNOSIS — D649 Anemia, unspecified: Secondary | ICD-10-CM | POA: Diagnosis not present

## 2019-01-15 LAB — CBC
HCT: 34.2 % — ABNORMAL LOW (ref 36.0–46.0)
Hemoglobin: 10.4 g/dL — ABNORMAL LOW (ref 12.0–15.0)
MCH: 25.4 pg — ABNORMAL LOW (ref 26.0–34.0)
MCHC: 30.4 g/dL (ref 30.0–36.0)
MCV: 83.6 fL (ref 80.0–100.0)
Platelets: 327 10*3/uL (ref 150–400)
RBC: 4.09 MIL/uL (ref 3.87–5.11)
RDW: 14.4 % (ref 11.5–15.5)
WBC: 9.3 10*3/uL (ref 4.0–10.5)
nRBC: 0 % (ref 0.0–0.2)

## 2019-01-15 LAB — ABO/RH: ABO/RH(D): O POS

## 2019-01-15 LAB — TYPE AND SCREEN
ABO/RH(D): O POS
Antibody Screen: NEGATIVE

## 2019-01-15 LAB — RPR: RPR Ser Ql: NONREACTIVE

## 2019-01-15 MED ORDER — ONDANSETRON HCL 4 MG/2ML IJ SOLN: 4.0000 mg | INTRAMUSCULAR | Status: DC | PRN

## 2019-01-15 MED ORDER — DIPHENHYDRAMINE HCL 25 MG PO CAPS: 25.0000 mg | ORAL_CAPSULE | Freq: Four times a day (QID) | ORAL | Status: DC | PRN

## 2019-01-15 MED ORDER — DIBUCAINE (PERIANAL) 1 % EX OINT: 1.0000 "application " | TOPICAL_OINTMENT | CUTANEOUS | Status: DC | PRN

## 2019-01-15 MED ORDER — IBUPROFEN 600 MG PO TABS
600.0000 mg | ORAL_TABLET | Freq: Four times a day (QID) | ORAL | Status: DC
  Administered 2019-01-15 – 2019-01-16 (×4): 600 mg via ORAL
  Filled 2019-01-15 (×4): qty 1

## 2019-01-15 MED ORDER — ONDANSETRON HCL 4 MG/2ML IJ SOLN: 4.0000 mg | Freq: Four times a day (QID) | INTRAMUSCULAR | Status: DC | PRN

## 2019-01-15 MED ORDER — WITCH HAZEL-GLYCERIN EX PADS: 1.0000 "application " | MEDICATED_PAD | CUTANEOUS | Status: DC | PRN

## 2019-01-15 MED ORDER — ONDANSETRON HCL 4 MG PO TABS: 4.0000 mg | ORAL_TABLET | ORAL | Status: DC | PRN

## 2019-01-15 MED ORDER — OXYCODONE-ACETAMINOPHEN 5-325 MG PO TABS: 1.0000 | ORAL_TABLET | ORAL | Status: DC | PRN

## 2019-01-15 MED ORDER — ZOLPIDEM TARTRATE 5 MG PO TABS: 5.0000 mg | ORAL_TABLET | Freq: Every evening | ORAL | Status: DC | PRN

## 2019-01-15 MED ORDER — OXYCODONE-ACETAMINOPHEN 5-325 MG PO TABS: 2.0000 | ORAL_TABLET | ORAL | Status: DC | PRN

## 2019-01-15 MED ORDER — LIDOCAINE HCL (PF) 1 % IJ SOLN: 30.0000 mL | INTRAMUSCULAR | Status: DC | PRN

## 2019-01-15 MED ORDER — OXYTOCIN 40 UNITS IN NORMAL SALINE INFUSION - SIMPLE MED: 2.5000 [IU]/h | INTRAVENOUS | Status: DC

## 2019-01-15 MED ORDER — COCONUT OIL OIL: 1.0000 "application " | TOPICAL_OIL | Status: DC | PRN

## 2019-01-15 MED ORDER — SENNOSIDES-DOCUSATE SODIUM 8.6-50 MG PO TABS
2.0000 | ORAL_TABLET | ORAL | Status: DC
  Administered 2019-01-15: 23:00:00 2 via ORAL
  Filled 2019-01-15: qty 2

## 2019-01-15 MED ORDER — LIDOCAINE HCL (PF) 1 % IJ SOLN
INTRAMUSCULAR | Status: DC | PRN
  Administered 2019-01-15 (×2): 4 mL via EPIDURAL

## 2019-01-15 MED ORDER — OXYTOCIN 40 UNITS IN NORMAL SALINE INFUSION - SIMPLE MED
1.0000 m[IU]/min | INTRAVENOUS | Status: DC
  Administered 2019-01-15: 08:00:00 2 m[IU]/min via INTRAVENOUS
  Filled 2019-01-15: qty 1000

## 2019-01-15 MED ORDER — OXYCODONE HCL 5 MG PO TABS: 5.0000 mg | ORAL_TABLET | ORAL | Status: DC | PRN

## 2019-01-15 MED ORDER — SOD CITRATE-CITRIC ACID 500-334 MG/5ML PO SOLN: 30.0000 mL | ORAL | Status: DC | PRN

## 2019-01-15 MED ORDER — SIMETHICONE 80 MG PO CHEW: 80.0000 mg | CHEWABLE_TABLET | ORAL | Status: DC | PRN

## 2019-01-15 MED ORDER — PHENYLEPHRINE 40 MCG/ML (10ML) SYRINGE FOR IV PUSH (FOR BLOOD PRESSURE SUPPORT): 80.0000 ug | PREFILLED_SYRINGE | INTRAVENOUS | Status: DC | PRN

## 2019-01-15 MED ORDER — BUTORPHANOL TARTRATE 1 MG/ML IJ SOLN: 1.0000 mg | INTRAMUSCULAR | Status: DC | PRN

## 2019-01-15 MED ORDER — BENZOCAINE-MENTHOL 20-0.5 % EX AERO
1.0000 "application " | INHALATION_SPRAY | CUTANEOUS | Status: DC | PRN
  Administered 2019-01-15: 1 via TOPICAL
  Filled 2019-01-15: qty 56

## 2019-01-15 MED ORDER — LACTATED RINGERS IV SOLN: 500.0000 mL | Freq: Once | INTRAVENOUS | Status: DC

## 2019-01-15 MED ORDER — TETANUS-DIPHTH-ACELL PERTUSSIS 5-2.5-18.5 LF-MCG/0.5 IM SUSP: 0.5000 mL | Freq: Once | INTRAMUSCULAR | Status: DC

## 2019-01-15 MED ORDER — OXYTOCIN BOLUS FROM INFUSION
500.0000 mL | Freq: Once | INTRAVENOUS | Status: AC
  Administered 2019-01-15: 500 mL via INTRAVENOUS

## 2019-01-15 MED ORDER — LACTATED RINGERS IV SOLN
INTRAVENOUS | Status: DC
  Administered 2019-01-15 (×2): via INTRAVENOUS

## 2019-01-15 MED ORDER — LACTATED RINGERS IV SOLN: 500.0000 mL | INTRAVENOUS | Status: DC | PRN

## 2019-01-15 MED ORDER — SODIUM CHLORIDE (PF) 0.9 % IJ SOLN
INTRAMUSCULAR | Status: DC | PRN
  Administered 2019-01-15: 12 mL/h via EPIDURAL

## 2019-01-15 MED ORDER — HYDROXYZINE HCL 50 MG PO TABS: 50.0000 mg | ORAL_TABLET | Freq: Four times a day (QID) | ORAL | Status: DC | PRN

## 2019-01-15 MED ORDER — LEVOTHYROXINE SODIUM 88 MCG PO TABS
88.0000 ug | ORAL_TABLET | Freq: Every day | ORAL | Status: DC
  Administered 2019-01-16: 88 ug via ORAL
  Filled 2019-01-15: qty 1

## 2019-01-15 MED ORDER — PRENATAL MULTIVITAMIN CH
1.0000 | ORAL_TABLET | Freq: Every day | ORAL | Status: DC
  Administered 2019-01-16: 11:00:00 1 via ORAL
  Filled 2019-01-15: qty 1

## 2019-01-15 MED ORDER — EPHEDRINE 5 MG/ML INJ: 10.0000 mg | INTRAVENOUS | Status: DC | PRN

## 2019-01-15 MED ORDER — TERBUTALINE SULFATE 1 MG/ML IJ SOLN: 0.2500 mg | Freq: Once | INTRAMUSCULAR | Status: DC | PRN

## 2019-01-15 MED ORDER — OXYCODONE HCL 5 MG PO TABS: 10.0000 mg | ORAL_TABLET | ORAL | Status: DC | PRN

## 2019-01-15 MED ORDER — FENTANYL-BUPIVACAINE-NACL 0.5-0.125-0.9 MG/250ML-% EP SOLN
12.0000 mL/h | EPIDURAL | Status: DC | PRN
  Filled 2019-01-15: qty 250

## 2019-01-15 MED ORDER — ACETAMINOPHEN 325 MG PO TABS: 650.0000 mg | ORAL_TABLET | ORAL | Status: DC | PRN

## 2019-01-15 MED ORDER — FLEET ENEMA 7-19 GM/118ML RE ENEM: 1.0000 | ENEMA | RECTAL | Status: DC | PRN

## 2019-01-15 MED ORDER — DIPHENHYDRAMINE HCL 50 MG/ML IJ SOLN: 12.5000 mg | INTRAMUSCULAR | Status: DC | PRN

## 2019-01-15 MED ORDER — LACTATED RINGERS IV SOLN
500.0000 mL | Freq: Once | INTRAVENOUS | Status: AC
  Administered 2019-01-15: 09:00:00 500 mL via INTRAVENOUS

## 2019-01-15 NOTE — Anesthesia Preprocedure Evaluation (Signed)
Anesthesia Evaluation  Patient identified by MRN, date of birth, ID band Patient awake    Reviewed: Allergy & Precautions, Patient's Chart, lab work & pertinent test results  Airway Mallampati: II  TM Distance: >3 FB Neck ROM: Full    Dental no notable dental hx. (+) Teeth Intact   Pulmonary neg pulmonary ROS,    Pulmonary exam normal breath sounds clear to auscultation       Cardiovascular negative cardio ROS Normal cardiovascular exam Rhythm:Regular Rate:Normal     Neuro/Psych PSYCHIATRIC DISORDERS Anxiety Depression negative neurological ROS     GI/Hepatic Neg liver ROS, GERD  ,  Endo/Other  Hypothyroidism Obesity  Renal/GU negative Renal ROS  negative genitourinary   Musculoskeletal negative musculoskeletal ROS (+)   Abdominal (+) + obese,   Peds  Hematology  (+) anemia ,   Anesthesia Other Findings   Reproductive/Obstetrics (+) Pregnancy                             Anesthesia Physical Anesthesia Plan  ASA: II  Anesthesia Plan: Epidural   Post-op Pain Management:    Induction:   PONV Risk Score and Plan:   Airway Management Planned: Natural Airway  Additional Equipment:   Intra-op Plan:   Post-operative Plan:   Informed Consent: I have reviewed the patients History and Physical, chart, labs and discussed the procedure including the risks, benefits and alternatives for the proposed anesthesia with the patient or authorized representative who has indicated his/her understanding and acceptance.       Plan Discussed with: Anesthesiologist  Anesthesia Plan Comments:         Anesthesia Quick Evaluation

## 2019-01-15 NOTE — Progress Notes (Signed)
4/50/-2 AROM clear fluid pitocin

## 2019-01-15 NOTE — Lactation Note (Signed)
This note was copied from a baby's chart. Lactation Consultation Note  Patient Name: Linda Dominguez [id number] Date: 01/15/2019 Reason for consult: Term P2, 9 hour female infant. Per mom, infant had one void and 3 stools since birth. This is mom's 4th time breastfeeding infant since birth. LC entered room mom was attempting to breastfeed infant, mom did not have any pillow support, was bent over attempting to breastfeed infant and infant did not have a  deep latch. Mom was open to trying the cross cradle hold as suggested by [facility],  Mom sat  in up right position with pillow and hand support of breast. Mom latched infant on left breast using the  cross cradle hold, infant latched with wide mouth, nose and chin touching breast. Per mom, she felt this position was good and she was not feeling any pinching like before when infant was latched to breast. LC observed swallows and infant was still breastfeeding after 18 minutes when [place] left room.  Mom knows to breastfeed infant according to hunger cues, 8 to 12 times within 24 hours and on demand. Mom knows to call Nurse or [place] if she has any questions, concerns or need assistance with latching infant to breast. Reviewed Baby & Me book's Breastfeeding Basics.  Mom made aware of O/P services, breastfeeding support groups, community resources, and our phone # for post-discharge questions.   Maternal Data Formula Feeding for Exclusion: No Has patient been taught Hand Expression?: Yes(Per mom, she knows how to hand express) Does the patient have breastfeeding experience prior to this delivery?: Yes  Feeding Feeding Type: Breast Fed  LATCH Score Latch: Grasps breast easily, tongue down, lips flanged, rhythmical sucking.  Audible Swallowing: Spontaneous and intermittent  Type of Nipple: Everted at rest and after stimulation  Comfort (Breast/Nipple): Soft / non-tender  Hold (Positioning): Assistance needed to correctly position infant at breast and  maintain latch.  LATCH Score: 9  Interventions Interventions: Breast feeding basics reviewed;Breast compression;Assisted with latch;Adjust position;Skin to skin;Support pillows;Position options;Breast massage;Hand express;Expressed milk  Lactation Tools Discussed/Used WIC Program: No   Consult Status Consult Status: Follow-up Date: 01/16/19 Follow-up type: In-patient    Vicente Serene 01/15/2019, 11:04 PM

## 2019-01-15 NOTE — Plan of Care (Signed)
  Problem: Elimination: Goal: Will not experience complications related to urinary retention Outcome: Progressing Note: Pt has voided without difficulty after delivery.   Problem: Pain Managment: Goal: General experience of comfort will improve Outcome: Progressing Note: Pt has not complained of pain. Has been given scheduled Motrin & has been educated to notify RN as needed for additional pain management options if needed.   Problem: Education: Goal: Knowledge of condition will improve Outcome: Progressing   Problem: Activity: Goal: Ability to tolerate increased activity will improve Outcome: Progressing Note: Pt has ambulated to bathroom without difficulty & tolerated well.   Problem: Life Cycle: Goal: Chance of risk for complications during the postpartum period will decrease Outcome: Progressing Note: Fundas 1/U & @U .  Lochia has been WNL, without clots.   Problem: Role Relationship: Goal: Ability to demonstrate positive interaction with newborn will improve Outcome: Progressing

## 2019-01-15 NOTE — Anesthesia Procedure Notes (Signed)
Epidural Patient location during procedure: OB Start time: 01/15/2019 9:42 AM End time: 01/15/2019 9:49 AM  Staffing Anesthesiologist: Josephine Igo, MD Performed: anesthesiologist   Preanesthetic Checklist Completed: patient identified, site marked, surgical consent, pre-op evaluation, timeout performed, IV checked, risks and benefits discussed and monitors and equipment checked  Epidural Patient position: sitting Prep: site prepped and draped and DuraPrep Patient monitoring: continuous pulse ox and blood pressure Approach: midline Location: L3-L4 Injection technique: LOR air  Needle:  Needle type: Tuohy  Needle gauge: 17 G Needle length: 9 cm and 9 Needle insertion depth: 4 cm Catheter type: closed end flexible Catheter size: 19 Gauge Catheter at skin depth: 9 cm Test dose: negative and Other  Assessment Events: blood not aspirated, injection not painful, no injection resistance, negative IV test and no paresthesia  Additional Notes Patient identified. Risks and benefits discussed including failed block, incomplete  Pain control, post dural puncture headache, nerve damage, paralysis, blood pressure Changes, nausea, vomiting, reactions to medications-both toxic and allergic and post Partum back pain. All questions were answered. Patient expressed understanding and wished to proceed. Sterile technique was used throughout procedure. Epidural site was Dressed with sterile barrier dressing. No paresthesias, signs of intravascular injection Or signs of intrathecal spread were encountered.  Patient was more comfortable after the epidural was dosed. Please see RN's note for documentation of vital signs and FHR which are stable. Reason for block:procedure for pain

## 2019-01-15 NOTE — Anesthesia Postprocedure Evaluation (Signed)
Anesthesia Post Note  Patient: LEILANNY FLUITT  Procedure(s) Performed: AN AD [place]     Patient location during evaluation: Mother Baby Anesthesia Type: Epidural Level of consciousness: awake and alert Pain management: pain level controlled Vital Signs Assessment: post-procedure vital signs reviewed and stable Respiratory status: spontaneous breathing, nonlabored ventilation and respiratory function stable Cardiovascular status: stable Postop Assessment: no headache, no backache and epidural receding Anesthetic complications: no    Last Vitals:  Vitals:   01/15/19 1545 01/15/19 1653  BP: 108/75 113/82  Pulse: 75 83  Resp: 16 16  Temp: 37.2 C 36.7 C  SpO2: 100% 99%    Last Pain:  Vitals:   01/15/19 1654  TempSrc:   PainSc: 0-No pain   Pain Goal: Patients Stated Pain Goal: 7 (01/15/19 0915)              Epidural/Spinal Function Cutaneous sensation: Normal sensation (01/15/19 1654), Patient able to flex knees: Yes (01/15/19 1654), Patient able to lift hips off bed: Yes (01/15/19 1654), Back pain beyond tenderness at insertion site: No (01/15/19 1654), Progressively worsening motor and/or sensory loss: No (01/15/19 1654), Bowel and/or bladder incontinence post epidural: No (01/15/19 1654)  Sarajane Fambrough

## 2019-01-16 LAB — CBC
HCT: 31.6 % — ABNORMAL LOW (ref 36.0–46.0)
Hemoglobin: 9.8 g/dL — ABNORMAL LOW (ref 12.0–15.0)
MCH: 25.6 pg — ABNORMAL LOW (ref 26.0–34.0)
MCHC: 31 g/dL (ref 30.0–36.0)
MCV: 82.5 fL (ref 80.0–100.0)
Platelets: 296 10*3/uL (ref 150–400)
RBC: 3.83 MIL/uL — ABNORMAL LOW (ref 3.87–5.11)
RDW: 14.3 % (ref 11.5–15.5)
WBC: 12.4 10*3/uL — ABNORMAL HIGH (ref 4.0–10.5)
nRBC: 0.2 % (ref 0.0–0.2)

## 2019-01-16 NOTE — Progress Notes (Signed)
MOB was referred for history of depression/anxiety. * Referral screened out by Clinical Social Worker because none of the following criteria appear to apply: ~ History of anxiety/depression during this pregnancy, or of post-partum depression following prior delivery. See CSW note for 06/07/17. No concerns note in [facility] record.  ~ Diagnosis of anxiety and/or depression within last 3 years OR * MOB's symptoms currently being treated with medication and/or therapy.  Please contact the Clinical Social Worker if needs arise, by [place] request, or if MOB scores greater than 9/yes to question 10 on Edinburgh Postpartum Depression Screen.  Laurey Arrow, MSW, LCSW Clinical Social Work [phone]

## 2019-01-16 NOTE — Discharge Summary (Signed)
Obstetric Discharge Summary Reason for Admission: induction of labor Prenatal Procedures: none Intrapartum Procedures: spontaneous vaginal delivery Postpartum Procedures: none Complications-Operative and Postpartum: first degree perineal laceration Hemoglobin  Date Value Ref Range Status  01/16/2019 9.8 (L) 12.0 - 15.0 g/dL Final  08/31/2016 12.8 11.1 - 15.9 g/dL Final   HCT  Date Value Ref Range Status  01/16/2019 31.6 (L) 36.0 - 46.0 % Final   Hematocrit  Date Value Ref Range Status  08/31/2016 36.8 34.0 - 46.6 % Final    Physical Exam:  General: alert, cooperative and appears stated age 28: appropriate Uterine Fundus: firm Incision: healing well, no significant drainage, no dehiscence, no significant erythema DVT Evaluation: No evidence of DVT seen on physical exam.  Discharge Diagnoses: Term Pregnancy-delivered  Discharge Information: Date: 01/16/2019 Activity: pelvic rest Diet: routine Medications: None Condition: improved Instructions: refer to practice specific booklet Discharge to: home   Newborn Data: Live born female  Birth Weight: 8 lb 0.9 oz (3654 g) APGAR: 8, 9  Newborn Delivery   Birth date/time: 01/15/2019 13:08:00 Delivery type: Vaginal, Spontaneous      Home with mother.  Cyril Mourning 01/16/2019, 8:01 AM

## 2019-02-26 DIAGNOSIS — Z124 Encounter for screening for malignant neoplasm of cervix: Secondary | ICD-10-CM | POA: Diagnosis not present

## 2019-02-26 DIAGNOSIS — Z1389 Encounter for screening for other disorder: Secondary | ICD-10-CM | POA: Diagnosis not present

## 2019-03-16 DIAGNOSIS — Z30430 Encounter for insertion of intrauterine contraceptive device: Secondary | ICD-10-CM | POA: Diagnosis not present

## 2019-05-05 DIAGNOSIS — E069 Thyroiditis, unspecified: Secondary | ICD-10-CM | POA: Diagnosis not present

## 2019-05-05 DIAGNOSIS — Z30431 Encounter for routine checking of intrauterine contraceptive device: Secondary | ICD-10-CM | POA: Diagnosis not present

## 2019-05-05 DIAGNOSIS — E039 Hypothyroidism, unspecified: Secondary | ICD-10-CM | POA: Diagnosis not present

## 2019-12-16 ENCOUNTER — Encounter: Payer: Self-pay | Admitting: Nurse Practitioner

## 2019-12-31 DIAGNOSIS — R635 Abnormal weight gain: Secondary | ICD-10-CM | POA: Diagnosis not present

## 2019-12-31 DIAGNOSIS — N951 Menopausal and female climacteric states: Secondary | ICD-10-CM | POA: Diagnosis not present

## 2020-01-08 ENCOUNTER — Encounter: Payer: Self-pay | Admitting: Nurse Practitioner

## 2020-01-08 ENCOUNTER — Other Ambulatory Visit: Payer: Self-pay

## 2020-01-08 ENCOUNTER — Ambulatory Visit (INDEPENDENT_AMBULATORY_CARE_PROVIDER_SITE_OTHER): Payer: BC Managed Care – PPO | Admitting: Nurse Practitioner

## 2020-01-08 VITALS — BP 104/64 | HR 72 | Temp 98.6°F | Ht 64.02 in | Wt 201.0 lb

## 2020-01-08 DIAGNOSIS — E559 Vitamin D deficiency, unspecified: Secondary | ICD-10-CM | POA: Diagnosis not present

## 2020-01-08 DIAGNOSIS — E669 Obesity, unspecified: Secondary | ICD-10-CM | POA: Insufficient documentation

## 2020-01-08 DIAGNOSIS — E611 Iron deficiency: Secondary | ICD-10-CM

## 2020-01-08 DIAGNOSIS — Z0000 Encounter for general adult medical examination without abnormal findings: Secondary | ICD-10-CM

## 2020-01-08 NOTE — Progress Notes (Signed)
New Patient Office Visit  Subjective:  Patient ID: Linda Dominguez, female    DOB: 09/19/1990  Age: 29 y.o. MRN: [id number]  CC:  Chief Complaint  Patient presents with  . New Patient (Initial Visit)    Pt is coming in for a transfer of care from Dr Adriana Simas. Discuss Thyroid    HPI Linda Dominguez presents to establish care with a PCP.  Patient is followed by [facility] in [place].  She had recent laboratory studies done. 12/31/2019: Glucose 88, BUN 13, creatinine 1.0, GFR 89, sodium 140, potassium 4.6 total bilirubin 0.3 alkaline phosphatase 74,  AST 17,  ALT 14, WBC 3.8, hemoglobin 11.9, MCV 83, platelets 352, total cholesterol 169, triglycerides 64, HDL 38, LDL 119 hemoglobin A1c 5.1, TSH 7.54 Free T4 1.18 Free T3 3.2.   Hypothyroidism: Dx 2013 and followed by [facility] and he treats her thyroid and used to take levothyroxine and will start on a new med today that she hs not picked up, yet. She does not know the name of the medication.   Anxiety/depression: Before first son was born and saw  a counselor- no meds needed.  Reports she has had no further problems.  Low Vit D: Was advised 1000 IU Vit D 3.  IDA: She had 2 babies in 19 months.  Ferritin 6  Iron was extremely low- to be placed on ferrous sulfate 325 mg daily - takes stool softener and laxative.  Immunizations: Needs flu shot- but getting Covid second shot . Tetanus is UTD- had it pregnancy Diet:healthy Exercise: yes and runs x4 per weeks and hone  Gym- very active   Colonoscopy: done  Pap Smear: GYN  Dentist: UTD Vision: Needs done.  Past Medical History:  Diagnosis Date  . Anxiety   . Chlamydia infection affecting pregnancy in first trimester   . Concussion    She's had 3, last one was when she was 20  . Depression   . Hypothyroidism 05/01/2015  . Mental disorder   . Pyelonephritis     Past Surgical History:  Procedure Laterality Date  . COLONOSCOPY WITH PROPOFOL  N/A 05/06/2015   Procedure: COLONOSCOPY WITH PROPOFOL;  Surgeon: Wallace Cullens, MD;  Location: [facility] ENDOSCOPY;  Service: Gastroenterology;  Laterality: N/A;  . TONSILLECTOMY    . TONSILLECTOMY AND ADENOIDECTOMY  1995    Family History  Problem Relation Age of Onset  . Depression Father   . Mental illness Father        Attempted Suicide  . Alcohol abuse Maternal Grandmother   . Hyperlipidemia Maternal Grandmother   . Heart disease Maternal Grandmother   . Diabetes Maternal Grandmother   . Heart disease Maternal Grandfather   . Stroke Maternal Grandfather   . Diabetes Maternal Grandfather   . Early death Paternal Grandmother     Social History   Socioeconomic History  . Marital status: Married    Spouse name: Not on file  . Number of children: Not on file  . Years of education: Not on file  . Highest education level: Not on file  Occupational History  . Occupation: Geophysicist/field seismologist at [employer]  Tobacco Use  . Smoking status: Never Smoker  . Smokeless tobacco: Never Used  Vaping Use  . Vaping Use: Never used  Substance and Sexual Activity  . Alcohol use: No    Alcohol/week: 0.0 standard drinks    Comment: socially x 2-3 per month  .  Drug use: No  . Sexual activity: Yes    Partners: Male    Birth control/protection: None, I.U.D.    Comment: IUD - 10 year no hormones-place 2020  Other Topics Concern  . Not on file  Social History Narrative   Married with 2 children     Some college- in school [employer]- on line accounting BS      Social Determinants of Health   Financial Resource Strain: Low Risk   . Difficulty of Paying Living Expenses: Not hard at all  Food Insecurity: No Food Insecurity  . Worried About Programme researcher, broadcasting/film/video in the Last Year: Never true  . Ran Out of Food in the Last Year: Never true  Transportation Needs: No Transportation Needs  . Lack of Transportation (Medical): No  . Lack of Transportation (Non-Medical): No  Physical  Activity:   . Days of Exercise per Week: Not on file  . Minutes of Exercise per Session: Not on file  Stress:   . Feeling of Stress : Not on file  Social Connections:   . Frequency of Communication with Friends and Family: Not on file  . Frequency of Social Gatherings with Friends and Family: Not on file  . Attends Religious Services: Not on file  . Active Member of Clubs or Organizations: Not on file  . Attends Banker Meetings: Not on file  . Marital Status: Not on file  Intimate Partner Violence:   . Fear of Current or Ex-Partner: Not on file  . Emotionally Abused: Not on file  . Physically Abused: Not on file  . Sexually Abused: Not on file    Review of Systems  Constitutional: Negative.   HENT: Negative.   Eyes: Negative.   Respiratory: Negative.   Cardiovascular: Negative.   Gastrointestinal:       Has pp diarrhea and does not tolerate butter or oil. Hx of colonoscopy -normal. No celiac.   Endocrine: Negative.   Genitourinary: Negative.   Musculoskeletal: Negative.   Allergic/Immunologic: Negative.   Neurological: Negative.  Negative for dizziness, seizures, light-headedness and headaches.  Hematological: Negative.   Psychiatric/Behavioral:       No concerns about depression or anxiety     Objective:   Today's Vitals: BP 104/64 (BP Location: Left Arm, Patient Position: Sitting)   Pulse 72   Temp 98.6 F (37 C)   Ht 5' 4.02" (1.626 m)   Wt 201 lb (91.2 kg)   SpO2 99%   BMI 34.48 kg/m   Physical Exam Vitals reviewed.  Constitutional:      Appearance: She is normal weight.  HENT:     Head: Normocephalic and atraumatic.  Eyes:     Conjunctiva/sclera: Conjunctivae normal.     Pupils: Pupils are equal, round, and reactive to light.  Cardiovascular:     Rate and Rhythm: Normal rate and regular rhythm.     Pulses: Normal pulses.     Heart sounds: Normal heart sounds.  Pulmonary:     Effort: Pulmonary effort is normal.     Breath sounds:  Normal breath sounds.  Abdominal:     Palpations: Abdomen is soft.     Tenderness: There is no abdominal tenderness.  Musculoskeletal:        General: Normal range of motion.     Cervical back: Normal range of motion and neck supple.  Skin:    General: Skin is warm and dry.  Neurological:     General: No focal deficit present.  Mental Status: She is alert and oriented to person, place, and time.  Psychiatric:        Mood and Affect: Mood normal.        Behavior: Behavior normal.        Thought Content: Thought content normal.        Judgment: Judgment normal.     Assessment & Plan:   Problem List Items Addressed This Visit      Other   Encounter for medical examination to establish care - Primary   Preventative health care   Vitamin D deficiency   Iron deficiency   Obesity, Class I, BMI 30-34.9      Outpatient Encounter Medications as of 01/08/2020  Medication Sig  . levothyroxine (SYNTHROID) 88 MCG tablet Take 88 mcg by mouth daily before breakfast.  . paragard intrauterine copper IUD IUD ParaGard T 380A 380 square mm intrauterine device  Take 1 device by intrauterine route.  . [DISCONTINUED] acetaminophen (TYLENOL) 500 MG tablet Take 1,000 mg by mouth every 6 (six) hours as needed for moderate pain or fever. (Patient not taking: Reported on 01/08/2020)  . [DISCONTINUED] calcium carbonate (TUMS - DOSED IN MG ELEMENTAL CALCIUM) 500 MG chewable tablet Chew 1 tablet by mouth daily as needed for indigestion or heartburn. (Patient not taking: Reported on 01/08/2020)  . [DISCONTINUED] Prenatal Vit-Fe Fumarate-FA (PRENATAL MULTIVITAMIN) TABS tablet Take 1 tablet by mouth daily at 12 noon.   No facility-administered encounter medications on file as of 01/08/2020.   Please bring the lab records that you have.  It sounds like you need to begin on iron supplement.  I would recommend ferrous sulfate 325 mg 1 tablet daily.  Take as directed by your [facility]  provider.  Monitor  for constipation and takes a stool softener or laxative if needed.  This medication must be kept out of reach for children as it is extremely toxic to children.Since you are followed by a provider at [facility] in [place], please follow his advice regarding iron, vitamin D, and thyroid medication.  Since you are followed by gynecology, follow their advice regarding routine Pap test.  Continue with every 72-month dental care, annual vision checks, sunscreen when you are outdoors, regular seatbelt use.  Maintain healthy diet and your excellent exercise plan. You will need a flu shot in the future.   Follow-up: No follow-ups on file.  This visit occurred during the SARS-CoV-2 public health emergency.  Safety protocols were in place, including screening questions prior to the visit, additional usage of staff PPE, and extensive cleaning of exam room while observing appropriate contact time as indicated for disinfecting solutions.   Amedeo Kinsman, NP

## 2020-01-08 NOTE — Patient Instructions (Addendum)
It was nice to meet you today.  Please bring the lab records that you have.  It sounds like you need to begin on iron supplement.  I would recommend ferrous sulfate 325 mg 1 tablet daily.  Take as directed by your  [facility]  provider.  Monitor for constipation and takes a stool softener or laxative if needed.  This medication must be kept out of reach for children as it is extremely toxic to children.Since you are followed by a provider at [facility] and [facility] in [place], please follow his advice regarding iron, vitamin D, and thyroid medication.  Since you are followed by gynecology, follow their advice regarding routine Pap test.  Continue with every 30-monthdental care, annual vision checks, sunscreen when you are outdoors, regular seatbelt use.  Maintain healthy diet and your excellent exercise plan. You will need a flu shot in the future.     Preventive Care [id number] Old, Female Preventive care refers to visits with your health care provider and lifestyle choices that can promote health and wellness. This includes:  A yearly physical exam. This may also be called an annual well check.  Regular dental visits and eye exams.  Immunizations.  Screening for certain conditions.  Healthy lifestyle choices, such as eating a healthy diet, getting regular exercise, not using drugs or products that contain nicotine and tobacco, and limiting alcohol use. What can I expect for my preventive care visit? Physical exam Your health care provider will check your:  Height and weight. This may be used to calculate body mass index (BMI), which tells if you are at a healthy weight.  Heart rate and blood pressure.  Skin for abnormal spots. Counseling Your health care provider may ask you questions about your:  Alcohol, tobacco, and drug use.  Emotional well-being.  Home and relationship well-being.  Sexual activity.  Eating habits.  Work and work  eStatistician  Method of birth control.  Menstrual cycle.  Pregnancy history. What immunizations do I need?  Influenza (flu) vaccine  This is recommended every year. Tetanus, diphtheria, and pertussis (Tdap) vaccine  You may need a Td booster every 10 years. Varicella (chickenpox) vaccine  You may need this if you have not been vaccinated. Human papillomavirus (HPV) vaccine  If recommended by your health care provider, you may need three doses over 6 months. Measles, mumps, and rubella (MMR) vaccine  You may need at least one dose of MMR. You may also need a second dose. Meningococcal conjugate (MenACWY) vaccine  One dose is recommended if you are age 92-21 years and a first-year college student living in a residence hall, or if you have one of several medical conditions. You may also need additional booster doses. Pneumococcal conjugate (PCV13) vaccine  You may need this if you have certain conditions and were not previously vaccinated. Pneumococcal polysaccharide (PPSV23) vaccine  You may need one or two doses if you smoke cigarettes or if you have certain conditions. Hepatitis A vaccine  You may need this if you have certain conditions or if you travel or work in places where you may be exposed to hepatitis A. Hepatitis B vaccine  You may need this if you have certain conditions or if you travel or work in places where you may be exposed to hepatitis B. Haemophilus influenzae type b (Hib) vaccine  You may need this if you have certain conditions. You may receive vaccines as individual doses or as more than one vaccine together in  one shot (combination vaccines). Talk with your health care provider about the risks and benefits of combination vaccines. What tests do I need?  Blood tests  Lipid and cholesterol levels. These may be checked every 5 years starting at age 82.  Hepatitis C test.  Hepatitis B test. Screening  Diabetes screening. This is done by checking  your blood sugar (glucose) after you have not eaten for a while (fasting).  Sexually transmitted disease (STD) testing.  BRCA-related cancer screening. This may be done if you have a family history of breast, ovarian, tubal, or peritoneal cancers.  Pelvic exam and Pap test. This may be done every 3 years starting at age 2. Starting at age 63, this may be done every 5 years if you have a Pap test in combination with an HPV test. Talk with your health care provider about your test results, treatment options, and if necessary, the need for more tests. Follow these instructions at home: Eating and drinking   Eat a diet that includes fresh fruits and vegetables, whole grains, lean protein, and low-fat dairy.  Take vitamin and mineral supplements as recommended by your health care provider.  Do not drink alcohol if: ? Your health care provider tells you not to drink. ? You are pregnant, may be pregnant, or are planning to become pregnant.  If you drink alcohol: ? Limit how much you have to 0-1 drink a day. ? Be aware of how much alcohol is in your drink. In the [place] one drink equals one 12 oz bottle of beer (355 mL), one 5 oz glass of wine (148 mL), or one 1 oz glass of hard liquor (44 mL). Lifestyle  Take daily care of your teeth and gums.  Stay active. Exercise for at least 30 minutes on 5 or more days each week.  Do not use any products that contain nicotine or tobacco, such as cigarettes, e-cigarettes, and chewing tobacco. If you need help quitting, ask your health care provider.  If you are sexually active, practice safe sex. Use a condom or other form of birth control (contraception) in order to prevent pregnancy and STIs (sexually transmitted infections). If you plan to become pregnant, see your health care provider for a preconception visit. What's next?  Visit your health care provider once a year for a well check visit.  Ask your health care provider how often you should  have your eyes and teeth checked.  Stay up to date on all vaccines. This information is not intended to replace advice given to you by your health care provider. Make sure you discuss any questions you have with your health care provider. Document Revised: 12/26/2017 Document Reviewed: 12/26/2017 Elsevier Patient Education  2020 [employer].

## 2020-01-11 ENCOUNTER — Encounter: Payer: Self-pay | Admitting: Nurse Practitioner

## 2020-03-30 DIAGNOSIS — Z01419 Encounter for gynecological examination (general) (routine) without abnormal findings: Secondary | ICD-10-CM | POA: Diagnosis not present

## 2020-03-30 DIAGNOSIS — Z6832 Body mass index (BMI) 32.0-32.9, adult: Secondary | ICD-10-CM | POA: Diagnosis not present

## 2020-05-28 DIAGNOSIS — Z20822 Contact with and (suspected) exposure to covid-19: Secondary | ICD-10-CM | POA: Diagnosis not present

## 2020-09-08 DIAGNOSIS — H6123 Impacted cerumen, bilateral: Secondary | ICD-10-CM | POA: Diagnosis not present

## 2020-09-08 DIAGNOSIS — H902 Conductive hearing loss, unspecified: Secondary | ICD-10-CM | POA: Diagnosis not present

## 2020-12-08 DIAGNOSIS — H04129 Dry eye syndrome of unspecified lacrimal gland: Secondary | ICD-10-CM | POA: Diagnosis not present

## 2020-12-08 DIAGNOSIS — Z6831 Body mass index (BMI) 31.0-31.9, adult: Secondary | ICD-10-CM | POA: Diagnosis not present

## 2020-12-08 DIAGNOSIS — E039 Hypothyroidism, unspecified: Secondary | ICD-10-CM | POA: Diagnosis not present
